# Patient Record
Sex: Female | Born: 1935 | Race: White | Hispanic: No | Smoking: Never smoker
Health system: Southern US, Community
[De-identification: ages and names within clinical notes are randomized; demographics above are authoritative.]

## PROBLEM LIST (undated history)

## (undated) DIAGNOSIS — I6529 Occlusion and stenosis of unspecified carotid artery: Secondary | ICD-10-CM

## (undated) DIAGNOSIS — I639 Cerebral infarction, unspecified: Secondary | ICD-10-CM

## (undated) DIAGNOSIS — I10 Essential (primary) hypertension: Secondary | ICD-10-CM

## (undated) DIAGNOSIS — I2510 Atherosclerotic heart disease of native coronary artery without angina pectoris: Secondary | ICD-10-CM

## (undated) HISTORY — PX: APPENDECTOMY: SHX54

## (undated) HISTORY — PX: CORONARY ARTERY BYPASS GRAFT: SHX141

## (undated) HISTORY — PX: THYROID SURGERY: SHX805

## (undated) HISTORY — PX: RETINAL DETACHMENT SURGERY: SHX105

---

## 2006-05-11 ENCOUNTER — Ambulatory Visit: Payer: Self-pay | Admitting: Internal Medicine

## 2006-07-12 ENCOUNTER — Encounter: Payer: Self-pay | Admitting: Internal Medicine

## 2006-07-15 ENCOUNTER — Encounter: Payer: Self-pay | Admitting: Internal Medicine

## 2006-08-15 ENCOUNTER — Encounter: Payer: Self-pay | Admitting: Internal Medicine

## 2006-09-15 ENCOUNTER — Encounter: Payer: Self-pay | Admitting: Internal Medicine

## 2006-10-03 ENCOUNTER — Other Ambulatory Visit: Payer: Self-pay

## 2006-10-03 ENCOUNTER — Emergency Department: Payer: Self-pay | Admitting: Emergency Medicine

## 2006-10-04 ENCOUNTER — Ambulatory Visit: Payer: Self-pay | Admitting: Ophthalmology

## 2006-10-20 ENCOUNTER — Ambulatory Visit: Payer: Self-pay | Admitting: Surgery

## 2006-11-20 ENCOUNTER — Ambulatory Visit: Payer: Self-pay | Admitting: Surgery

## 2006-11-27 ENCOUNTER — Inpatient Hospital Stay: Payer: Self-pay | Admitting: Surgery

## 2007-01-01 ENCOUNTER — Ambulatory Visit: Payer: Self-pay | Admitting: Surgery

## 2010-03-12 ENCOUNTER — Emergency Department: Payer: Self-pay | Admitting: Emergency Medicine

## 2010-03-18 ENCOUNTER — Ambulatory Visit: Payer: Self-pay | Admitting: Surgery

## 2010-03-19 ENCOUNTER — Ambulatory Visit: Payer: Self-pay | Admitting: Surgery

## 2010-03-31 ENCOUNTER — Ambulatory Visit: Payer: Self-pay | Admitting: Surgery

## 2010-04-13 ENCOUNTER — Ambulatory Visit: Payer: Self-pay | Admitting: Surgery

## 2010-09-21 ENCOUNTER — Ambulatory Visit: Payer: Self-pay | Admitting: Ophthalmology

## 2010-09-29 ENCOUNTER — Ambulatory Visit: Payer: Self-pay | Admitting: Ophthalmology

## 2011-01-20 IMAGING — US ABDOMEN ULTRASOUND
1 series · 17 of 25 positions shown · non-contrast
Comparison: none

REASON FOR EXAM: post operative bd pain nausea
COMMENTS:

[Series 1: abdomen ultrasound · 17 of 43 slices shown]
[im 1/43]
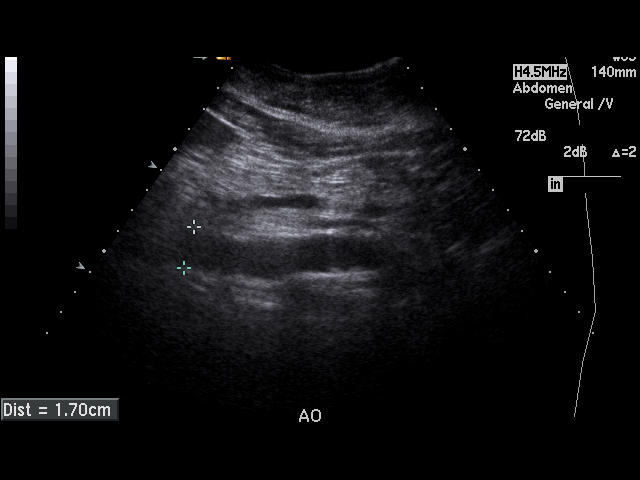
[im 4/43]
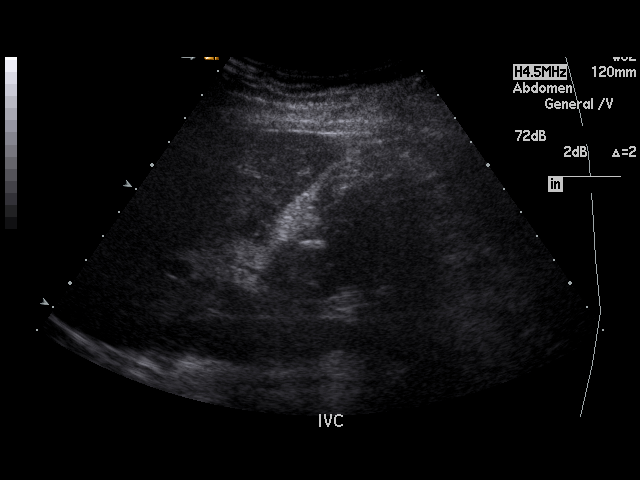
[im 6/43]
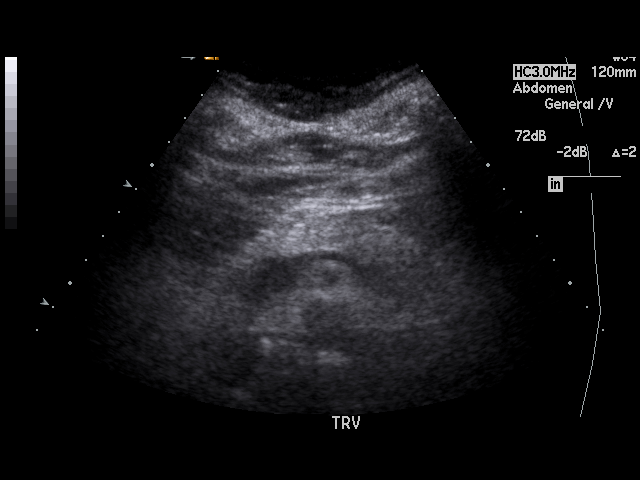
[im 9/43]
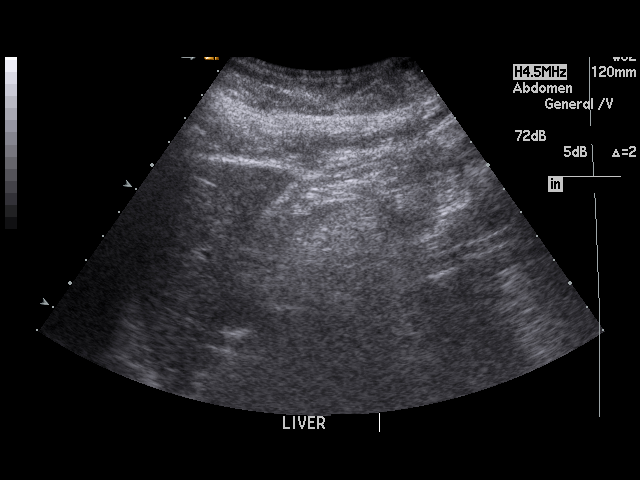
[im 11/43]
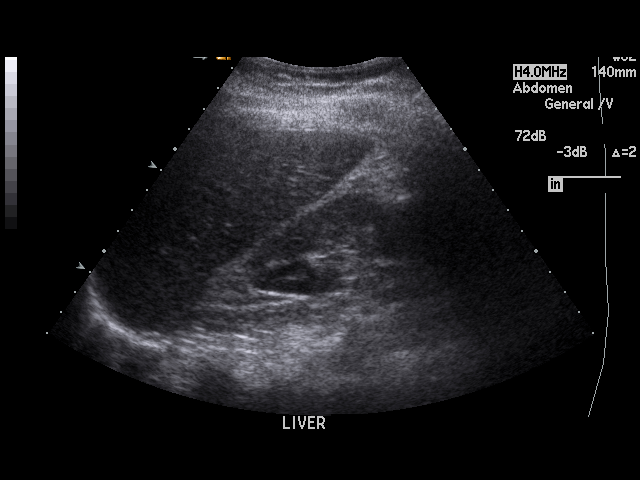
[im 15/43]
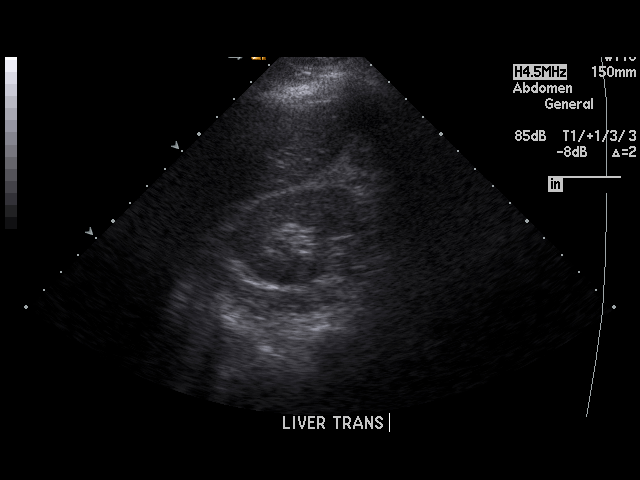
[im 16/43]
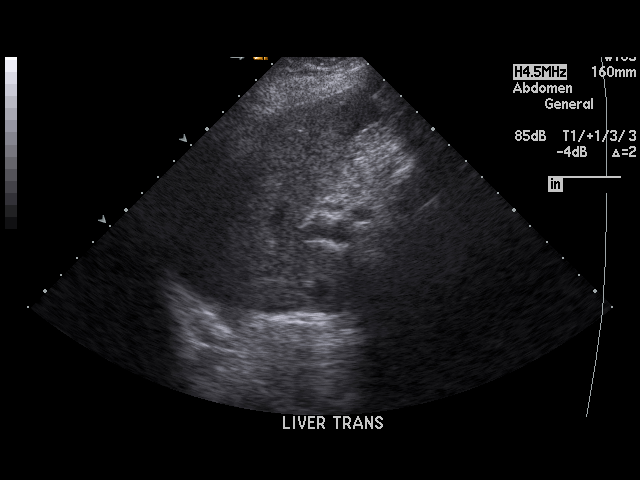
[im 20/43]
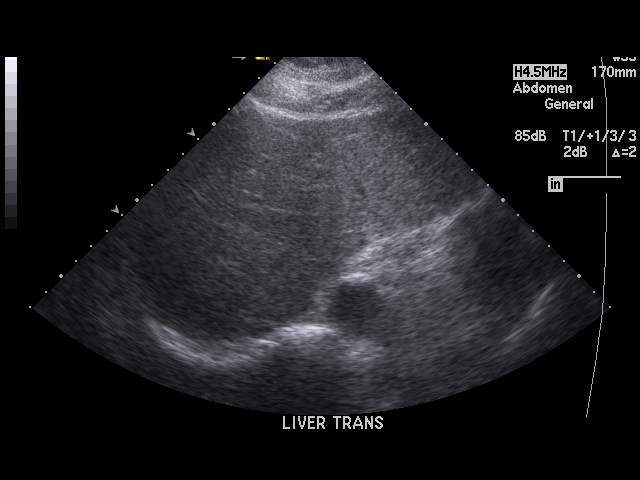
[im 22/43]
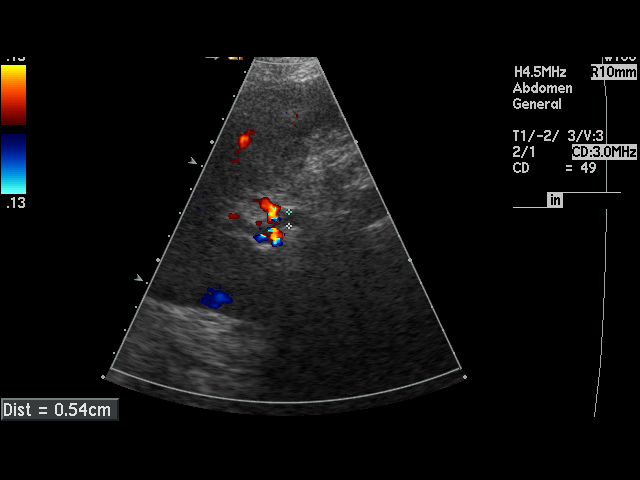
[im 23/43]
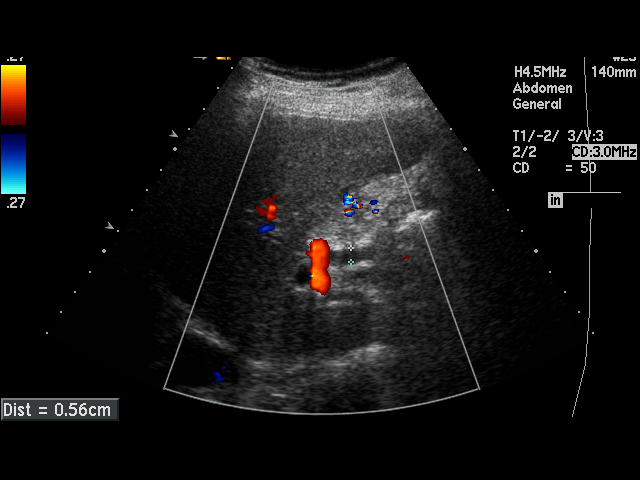
[im 27/43]
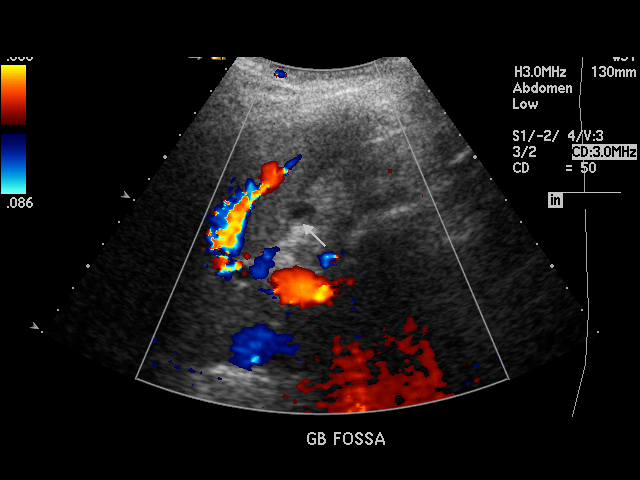
[im 29/43]
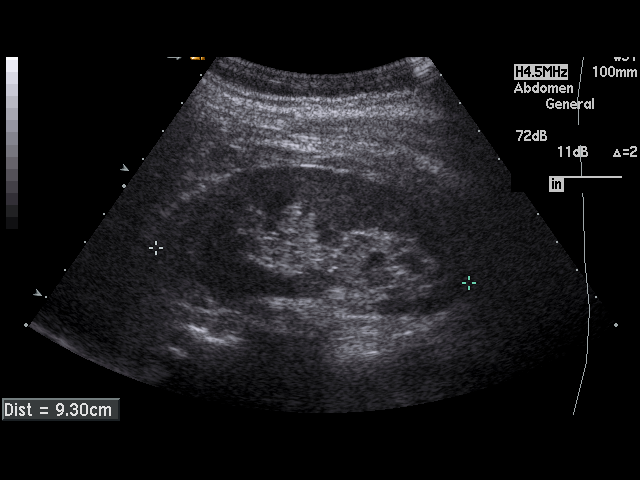
[im 32/43]
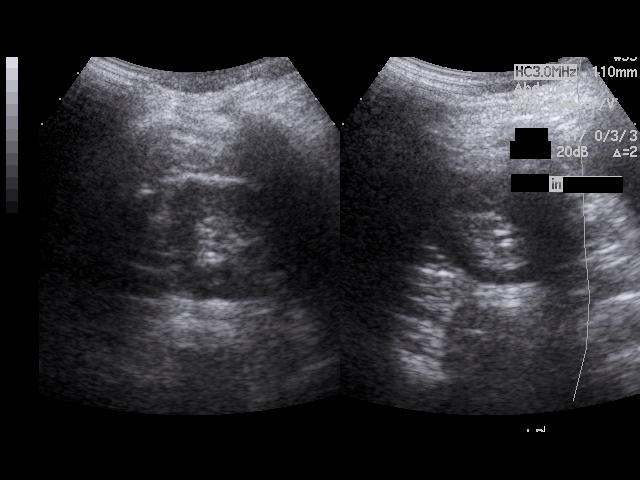
[im 34/43]
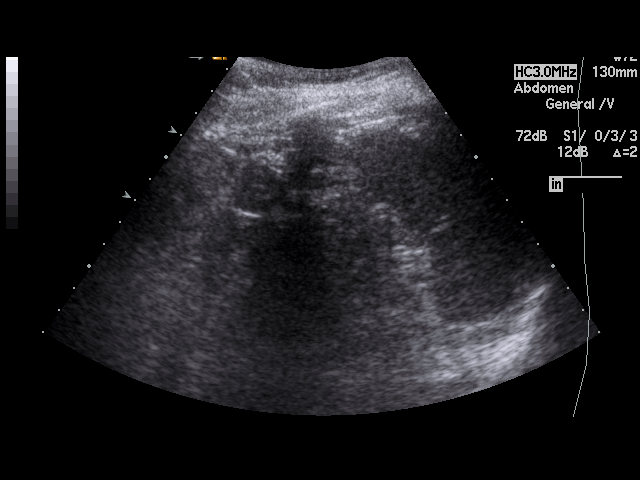
[im 37/43]
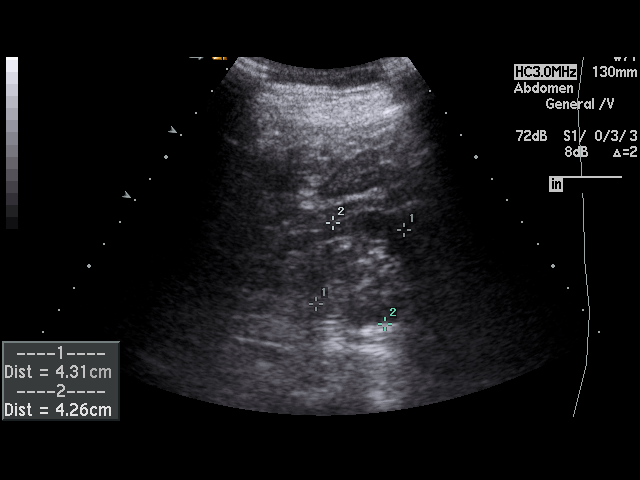
[im 39/43]
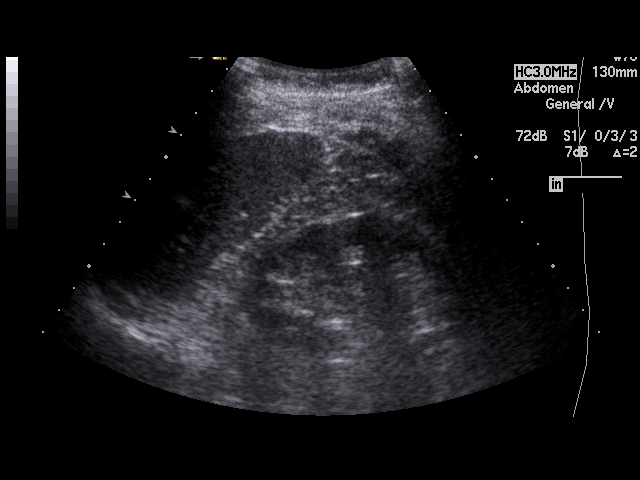
[im 43/43]
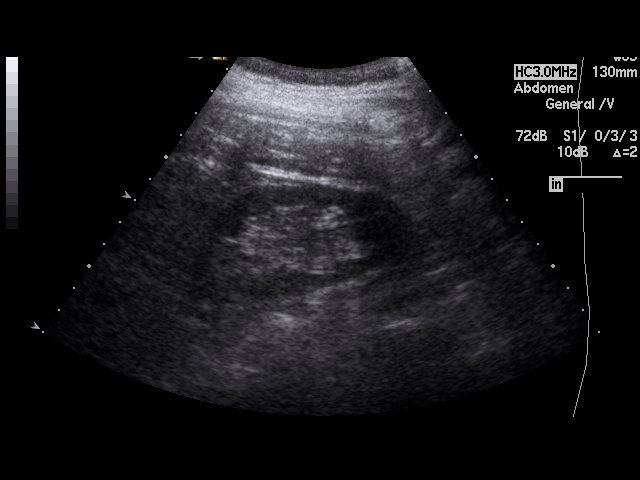

[17 of 25 positions shown; findings below may reference images not displayed]

PROCEDURE:     HOUSSEM EDDIN - HOUSSEM EDDIN ABDOMEN GENERAL SURVEY  - April 13, 2010  [DATE]

RESULT:     The hepatic echo pattern shows no significant abnormalities. The
visualized portion of the pancreas is normal in appearance. The abdominal
aorta and inferior vena cava show no significant abnormalities. Spleen size
is normal. The gallbladder is not seen compatible with prior
cholecystectomy. There is a trace of fluid in the region of the gallbladder
bed, less than 1 cm in diameter and which likely represents postoperative
change. The common bile duct measures 5.6 mm in diameter which is within
normal limits. The kidneys show no hydronephrosis.
IMPRESSION: There is a trace of fluid in the region of the gallbladder bed, otherwise
normal study.

## 2011-08-16 ENCOUNTER — Ambulatory Visit: Payer: Self-pay | Admitting: Internal Medicine

## 2011-08-31 ENCOUNTER — Ambulatory Visit: Payer: Self-pay | Admitting: Internal Medicine

## 2011-09-06 LAB — CBC CANCER CENTER
Eosinophil #: 0.1 x10 3/mm (ref 0.0–0.7)
HCT: 35.6 % (ref 35.0–47.0)
Lymphocyte #: 1.3 x10 3/mm (ref 1.0–3.6)
Lymphocyte %: 33.9 %
MCH: 30.7 pg (ref 26.0–34.0)
MCHC: 33.5 g/dL (ref 32.0–36.0)
Monocyte #: 0.3 x10 3/mm (ref 0.0–0.7)
Monocyte %: 8.8 %
Neutrophil #: 2.2 x10 3/mm (ref 1.4–6.5)
Neutrophil %: 54.4 %
RDW: 12.6 % (ref 11.5–14.5)
WBC: 3.9 x10 3/mm (ref 3.6–11.0)

## 2011-09-06 LAB — RETICULOCYTES: Reticulocyte: 2.7 % — ABNORMAL HIGH (ref 0.5–1.5)

## 2011-09-06 LAB — FERRITIN: Ferritin (ARMC): 43 ng/mL (ref 8–388)

## 2011-09-06 LAB — IRON AND TIBC
Iron Saturation: 32 %
Unbound Iron-Bind.Cap.: 218 ug/dL

## 2011-09-07 LAB — KAPPA/LAMBDA FREE LIGHT CHAINS (ARMC)

## 2011-09-13 LAB — CBC CANCER CENTER
Basophil #: 0 x10 3/mm (ref 0.0–0.1)
Basophil %: 0.6 %
Eosinophil #: 0.1 x10 3/mm (ref 0.0–0.7)
Eosinophil %: 2.8 %
HCT: 36 % (ref 35.0–47.0)
HGB: 11.7 g/dL — ABNORMAL LOW (ref 12.0–16.0)
Lymphocyte #: 1.3 x10 3/mm (ref 1.0–3.6)
Lymphocyte %: 36.1 %
MCHC: 32.5 g/dL (ref 32.0–36.0)
Monocyte %: 10.2 %
Neutrophil #: 1.7 x10 3/mm (ref 1.4–6.5)
Neutrophil %: 50.3 %
RDW: 12.6 % (ref 11.5–14.5)
WBC: 3.5 x10 3/mm — ABNORMAL LOW (ref 3.6–11.0)

## 2011-09-14 LAB — URINE IEP, RANDOM

## 2011-09-16 ENCOUNTER — Ambulatory Visit: Payer: Self-pay | Admitting: Internal Medicine

## 2012-03-01 ENCOUNTER — Ambulatory Visit: Payer: Self-pay | Admitting: Otolaryngology

## 2012-03-01 LAB — T4, FREE: Free Thyroxine: 1.32 ng/dL (ref 0.76–1.46)

## 2012-03-06 ENCOUNTER — Ambulatory Visit: Payer: Self-pay | Admitting: Otolaryngology

## 2012-05-01 ENCOUNTER — Ambulatory Visit: Payer: Self-pay | Admitting: Otolaryngology

## 2012-05-01 LAB — BASIC METABOLIC PANEL
Anion Gap: 6 — ABNORMAL LOW (ref 7–16)
Calcium, Total: 8.9 mg/dL (ref 8.5–10.1)
Co2: 26 mmol/L (ref 21–32)
Glucose: 124 mg/dL — ABNORMAL HIGH (ref 65–99)
Osmolality: 269 (ref 275–301)

## 2012-05-01 LAB — CBC WITH DIFFERENTIAL/PLATELET
Basophil %: 0.6 %
Eosinophil #: 0.1 10*3/uL (ref 0.0–0.7)
Eosinophil %: 1.5 %
HCT: 33.6 % — ABNORMAL LOW (ref 35.0–47.0)
HGB: 11.3 g/dL — ABNORMAL LOW (ref 12.0–16.0)
Lymphocyte %: 31.1 %
MCH: 28.8 pg (ref 26.0–34.0)
MCHC: 33.6 g/dL (ref 32.0–36.0)
Neutrophil #: 2.2 10*3/uL (ref 1.4–6.5)
Neutrophil %: 57.8 %
RBC: 3.92 10*6/uL (ref 3.80–5.20)

## 2012-05-14 ENCOUNTER — Ambulatory Visit: Payer: Self-pay | Admitting: Otolaryngology

## 2012-05-16 LAB — PATHOLOGY REPORT

## 2012-09-20 ENCOUNTER — Ambulatory Visit: Payer: Self-pay | Admitting: Family Medicine

## 2012-10-03 ENCOUNTER — Ambulatory Visit: Payer: Self-pay | Admitting: Otolaryngology

## 2012-10-03 LAB — T4, FREE: Free Thyroxine: 1.17 ng/dL (ref 0.76–1.46)

## 2013-01-01 ENCOUNTER — Ambulatory Visit: Payer: Self-pay | Admitting: Otolaryngology

## 2013-01-01 LAB — TSH: Thyroid Stimulating Horm: 1.89 u[IU]/mL

## 2013-04-01 ENCOUNTER — Ambulatory Visit: Payer: Self-pay | Admitting: Physical Medicine and Rehabilitation

## 2013-07-08 ENCOUNTER — Ambulatory Visit: Payer: Self-pay | Admitting: Otolaryngology

## 2013-11-19 DIAGNOSIS — I447 Left bundle-branch block, unspecified: Secondary | ICD-10-CM | POA: Insufficient documentation

## 2013-11-19 DIAGNOSIS — I25810 Atherosclerosis of coronary artery bypass graft(s) without angina pectoris: Secondary | ICD-10-CM | POA: Insufficient documentation

## 2014-04-22 ENCOUNTER — Emergency Department: Payer: Self-pay | Admitting: Emergency Medicine

## 2014-04-22 LAB — BASIC METABOLIC PANEL
Anion Gap: 7 (ref 7–16)
BUN: 13 mg/dL (ref 7–18)
CHLORIDE: 100 mmol/L (ref 98–107)
Calcium, Total: 9 mg/dL (ref 8.5–10.1)
Co2: 26 mmol/L (ref 21–32)
Creatinine: 1.4 mg/dL — ABNORMAL HIGH (ref 0.60–1.30)
EGFR (African American): 38 — ABNORMAL LOW
GFR CALC NON AF AMER: 33 — AB
Glucose: 111 mg/dL — ABNORMAL HIGH (ref 65–99)
OSMOLALITY: 267 (ref 275–301)
Potassium: 4.3 mmol/L (ref 3.5–5.1)
Sodium: 133 mmol/L — ABNORMAL LOW (ref 136–145)

## 2014-04-22 LAB — CBC WITH DIFFERENTIAL/PLATELET
BASOS PCT: 0.7 %
Basophil #: 0 10*3/uL (ref 0.0–0.1)
EOS ABS: 0.1 10*3/uL (ref 0.0–0.7)
Eosinophil %: 1.6 %
HCT: 42.1 % (ref 35.0–47.0)
HGB: 13.4 g/dL (ref 12.0–16.0)
LYMPHS ABS: 1.3 10*3/uL (ref 1.0–3.6)
Lymphocyte %: 26.6 %
MCH: 28 pg (ref 26.0–34.0)
MCHC: 31.9 g/dL — ABNORMAL LOW (ref 32.0–36.0)
MCV: 88 fL (ref 80–100)
MONOS PCT: 8.8 %
Monocyte #: 0.4 x10 3/mm (ref 0.2–0.9)
NEUTROS PCT: 62.3 %
Neutrophil #: 2.9 10*3/uL (ref 1.4–6.5)
Platelet: 182 10*3/uL (ref 150–440)
RBC: 4.8 10*6/uL (ref 3.80–5.20)
RDW: 14.6 % — ABNORMAL HIGH (ref 11.5–14.5)
WBC: 4.7 10*3/uL (ref 3.6–11.0)

## 2014-04-22 LAB — URINALYSIS, COMPLETE
Bilirubin,UR: NEGATIVE
GLUCOSE, UR: NEGATIVE mg/dL (ref 0–75)
Ketone: NEGATIVE
NITRITE: NEGATIVE
Ph: 6 (ref 4.5–8.0)
Protein: 30
RBC,UR: 2 /HPF (ref 0–5)
Specific Gravity: 1.008 (ref 1.003–1.030)
WBC UR: 6 /HPF (ref 0–5)

## 2014-04-22 LAB — TROPONIN I

## 2014-04-30 DIAGNOSIS — G2581 Restless legs syndrome: Secondary | ICD-10-CM | POA: Insufficient documentation

## 2014-07-16 ENCOUNTER — Ambulatory Visit: Payer: Self-pay | Admitting: Otolaryngology

## 2014-07-16 LAB — T4, FREE: Free Thyroxine: 1.34 ng/dL (ref 0.76–1.46)

## 2014-07-16 LAB — TSH: Thyroid Stimulating Horm: 1.61 u[IU]/mL

## 2014-07-30 DIAGNOSIS — I340 Nonrheumatic mitral (valve) insufficiency: Secondary | ICD-10-CM | POA: Insufficient documentation

## 2014-07-30 DIAGNOSIS — I272 Pulmonary hypertension, unspecified: Secondary | ICD-10-CM | POA: Insufficient documentation

## 2014-10-29 DIAGNOSIS — E039 Hypothyroidism, unspecified: Secondary | ICD-10-CM | POA: Insufficient documentation

## 2014-11-14 ENCOUNTER — Ambulatory Visit
Admit: 2014-11-14 | Disposition: A | Discharge status: Home / Self Care | Payer: Self-pay | Attending: Ophthalmology | Admitting: Ophthalmology

## 2014-11-19 ENCOUNTER — Ambulatory Visit
Admit: 2014-11-19 | Disposition: A | Discharge status: Home / Self Care | Payer: Self-pay | Attending: Ophthalmology | Admitting: Ophthalmology

## 2014-12-02 NOTE — Op Note (Signed)
PATIENT NAME:  Gabriella Hawkins, Gabriella Hawkins MR#:  [id number] DATE OF BIRTH:  10-20-1919  DATE OF PROCEDURE:  05/14/2012  PREOPERATIVE DIAGNOSIS: Large left thyroid nodule.   POSTOPERATIVE DIAGNOSIS: Large left thyroid nodule.  OPERATIVE PROCEDURE: Excision of left thyroid gland and isthmus.  SURGEON: Cammy CopaPaul H. Zayquan Bogard, MD   ASSISTANT: Karlyne GreenspanWilliam Vaught, MD   ANESTHESIA: General.   COMPLICATIONS: None.   TOTAL ESTIMATED BLOOD LOSS: 25 mL.   DESCRIPTION OF PROCEDURE: The patient was given general anesthesia by oral endotracheal intubation. A special endotracheal tube was used for monitoring of the laryngeal muscles and the recurrent laryngeal nerve. This was visualized to make sure it was sitting in the right position in the larynx. The patient was placed in the spine position. A shoulder roll was placed and the neck extended slightly. The neck was marked about three fingerbreadths above the sternal notch in a curvilinear horizontal fashion. The skin was infiltrated with 7 mL of 1% Xylocaine with epinephrine 1:100,000. She was prepped and draped in the sterile fashion.   An incision was created through the skin and subcutaneous tissue. The platysma muscle was divided and superiorly and inferiorly based flaps were elevated slightly. The strap muscles were then divided in the midline and were elevated over the left thyroid gland. This was a very large thyroid gland and it was across the midline over the thyroid as well. Dissection was carried around the gland underneath the strap muscles superiorly and inferiorly. The inferior attachments were freed up first. The inferior thyroid vessels were cut across with the Ultrus thermal cutting system. This coagulated and cut the vessels and freed up the inferior pole. The more lateral veins were cut across as well. The more superior pole was then freed up and again the superior vessels were cut across with the Ultrus coagulating and cutting system. Once the superior and  inferior poles were freed up, then the entire gland was rotated and it could be freed up and delivered from the wound. It was sitting out and the entire mass was probably close to 8 cm in size from superior to inferior and at least 6 cm in width. It crossed over the midline of the thyroid. It was soft and quite vascular. The bed underneath it laterally now was freed up some and the recurrent laryngeal nerve was found. It was stimulated and working well. The blood vessels overlying this were freed up and then Berry's ligament was freed up as well. Once this was freed, the gland was then only attached by the isthmus across to the right gland. The right gland was not enlarged. They were swollen. There were no nodules palpable. The isthmus was cut off as it attached to the right gland. This removed the entire gland and isthmus on the left side.   The bed was irrigated. They were a couple of small bleeding areas that were coagulated. It appeared as though a left superior parathyroid gland was found but not an inferior parathyroid gland. The recurrent laryngeal nerve was stimulated again and this moved the muscles well.   The bed was then covered with a half a piece of Surgicel and then a 10 [place] TLS drain was placed through a separate stab incision and laid down into the wound.   Wound was closed using 4-0 Vicryls for closing the strap muscles and then 4-0 Vicryls for the platysma, 4-0 Vicryls for the dermis, and then staples for the skin. The drain was placed to low continuous Vacutainer suction. The  wound was dressed with bacitracin, Telfa, and Tegaderm.  The patient tolerated the procedure well. She was awakened and taken to the recovery room in satisfactory condition. There were no operative complications.   ____________________________ Cammy Copa, MD phj:drc D: 05/14/2012 10:05:47 ET T: 05/14/2012 10:24:59 ET JOB#: [id number]  cc: Cammy Copa, MD, <Dictator> Cammy Copa MD ELECTRONICALLY  SIGNED 05/14/2012 19:02

## 2014-12-14 NOTE — Op Note (Signed)
PATIENT NAME:  Gabriella Hawkins, Gabriella Hawkins MR#:  [id number] DATE OF BIRTH:  1920/03/11  DATE OF PROCEDURE:  11/19/2014  PREPROCEDURE DIAGNOSIS: Epiretinal membrane with cystoid macular edema, left eye.   POSTPROCEDURE DIAGNOSES:  Epiretinal membrane with cystoid macular edema, left eye.  PROCEDURE: A 25-gauge pars plana vitrectomy with ICG, ERM, and ILM peeling, a partial air-fluid exchange.   ANESTHESIA: Monitored anesthesia care with retrobulbar block.   COMPLICATIONS: None.   ESTIMATED BLOOD LOSS: Minimal.  SPECIMENS:  None.  DESCRIPTION OF PROCEDURE: The patient was evaluated in the clinic for a visually significant epiretinal membrane in the left eye. Risks, benefits, alternatives, and the complications were discussed with patient. The patient elected to proceed with pars plana vitrectomy surgery with membrane peeling. Of note, the patient did have a previous vitrectomy in the past.  On the day of surgery, the patient was greeted in the preoperative holding area. The consents were reviewed. The left eye was marked. The patient was taken to the operating room in a supine position.  Monitored anesthesia care was administered and 5 mL of a peribulbar block consisting of a lidocaine and Marcaine plain with Wydase was injected. The left eye was then prepped and draped in the usual sterile fashion.  Twenty-five-gauge trocars were placed in the usual position. The infusion cannula was checked to make sure it was in in the vitreous cavity before starting the infusion. The patient did have a prior vitrectomy, so there was not much vitreous remaining in the vitreous cavity, but it was shortened for 360 degrees around the periphery. Attention was then directed to the macula. ICG was used to stain the retinal surface. The patient indeed had an epiretinal membrane causing significant distortion of the macula. The ILM forceps were used to then peel the ILM and epiretinal membrane around the fovea.  The ILM had been  partially peeled in a previous surgery and so that edge was taken out further for 360 degrees around the fovea and the epiretinal membrane on the fovea was removed. The attention was then drawn to the periphery and 360 degrees scleral depression was performed with no new retinal tears or detachments noted. The infusion was then turned to air and the patient was given a partial air bubble.  The trocars were then removed and they were felt to be watertight. Palpation was soft.  The subconjunctival dexamethasone was injected as the patient has a penicillin allergy and the patient was then patched and shielded and taken to the recovery area in stable condition.   ____________________________ Princella PellegriniJessica D. Duke Salviaandolph, MD jdr:sp D: 11/19/2014 10:00:31 ET T: 11/19/2014 10:57:59 ET JOB#: [id number]  cc: Shanda BumpsJessica D. Duke Salviaandolph, MD, <Dictator> Dartha LodgeJESSICA D Winfall MD ELECTRONICALLY SIGNED 11/26/2014 10:31

## 2015-01-23 DIAGNOSIS — I10 Essential (primary) hypertension: Secondary | ICD-10-CM | POA: Insufficient documentation

## 2015-02-23 DIAGNOSIS — I071 Rheumatic tricuspid insufficiency: Secondary | ICD-10-CM | POA: Insufficient documentation

## 2015-07-22 ENCOUNTER — Other Ambulatory Visit
Admission: RE | Admit: 2015-07-22 | Discharge: 2015-07-22 | Disposition: A | Discharge status: Home / Self Care | Payer: Medicare Other | Source: Ambulatory Visit | Attending: Otolaryngology | Admitting: Otolaryngology

## 2015-07-22 DIAGNOSIS — E039 Hypothyroidism, unspecified: Secondary | ICD-10-CM | POA: Diagnosis present

## 2015-07-22 LAB — TSH: TSH: 0.652 u[IU]/mL (ref 0.350–4.500)

## 2015-07-22 LAB — T4, FREE: FREE T4: 1.07 ng/dL (ref 0.61–1.12)

## 2015-07-23 LAB — T3, FREE: T3, Free: 2.3 pg/mL (ref 2.0–4.4)

## 2015-11-03 DIAGNOSIS — H3411 Central retinal artery occlusion, right eye: Secondary | ICD-10-CM | POA: Insufficient documentation

## 2015-11-03 DIAGNOSIS — H35372 Puckering of macula, left eye: Secondary | ICD-10-CM | POA: Insufficient documentation

## 2016-01-26 DIAGNOSIS — E538 Deficiency of other specified B group vitamins: Secondary | ICD-10-CM | POA: Insufficient documentation

## 2016-08-16 ENCOUNTER — Other Ambulatory Visit
Admission: RE | Admit: 2016-08-16 | Discharge: 2016-08-16 | Disposition: A | Discharge status: Home / Self Care | Payer: Medicare Other | Source: Ambulatory Visit | Attending: Otolaryngology | Admitting: Otolaryngology

## 2016-08-16 DIAGNOSIS — E890 Postprocedural hypothyroidism: Secondary | ICD-10-CM | POA: Diagnosis present

## 2016-08-16 LAB — TSH: TSH: 0.661 u[IU]/mL (ref 0.350–4.500)

## 2016-08-16 LAB — T4, FREE: FREE T4: 1.3 ng/dL — AB (ref 0.61–1.12)

## 2016-08-17 LAB — T3, FREE: T3, Free: 2.1 pg/mL (ref 2.0–4.4)

## 2017-05-17 ENCOUNTER — Encounter: Payer: Self-pay | Admitting: Internal Medicine

## 2017-05-17 ENCOUNTER — Observation Stay
Admission: EM | Admit: 2017-05-17 | Discharge: 2017-05-18 | Disposition: A | Discharge status: Home / Self Care | Payer: Medicare Other | Attending: Internal Medicine | Admitting: Internal Medicine

## 2017-05-17 ENCOUNTER — Observation Stay
Admit: 2017-05-17 | Discharge: 2017-05-17 | Disposition: A | Discharge status: Home / Self Care | Payer: Medicare Other | Attending: Internal Medicine | Admitting: Internal Medicine

## 2017-05-17 ENCOUNTER — Emergency Department: Payer: Medicare Other

## 2017-05-17 DIAGNOSIS — Z79899 Other long term (current) drug therapy: Secondary | ICD-10-CM | POA: Insufficient documentation

## 2017-05-17 DIAGNOSIS — I25110 Atherosclerotic heart disease of native coronary artery with unstable angina pectoris: Secondary | ICD-10-CM | POA: Diagnosis not present

## 2017-05-17 DIAGNOSIS — I214 Non-ST elevation (NSTEMI) myocardial infarction: Principal | ICD-10-CM | POA: Insufficient documentation

## 2017-05-17 DIAGNOSIS — Z888 Allergy status to other drugs, medicaments and biological substances status: Secondary | ICD-10-CM | POA: Diagnosis not present

## 2017-05-17 DIAGNOSIS — Z8673 Personal history of transient ischemic attack (TIA), and cerebral infarction without residual deficits: Secondary | ICD-10-CM | POA: Diagnosis not present

## 2017-05-17 DIAGNOSIS — Z7982 Long term (current) use of aspirin: Secondary | ICD-10-CM | POA: Diagnosis not present

## 2017-05-17 DIAGNOSIS — Z951 Presence of aortocoronary bypass graft: Secondary | ICD-10-CM | POA: Diagnosis not present

## 2017-05-17 DIAGNOSIS — G2581 Restless legs syndrome: Secondary | ICD-10-CM | POA: Diagnosis not present

## 2017-05-17 DIAGNOSIS — I119 Hypertensive heart disease without heart failure: Secondary | ICD-10-CM | POA: Insufficient documentation

## 2017-05-17 DIAGNOSIS — R079 Chest pain, unspecified: Secondary | ICD-10-CM | POA: Diagnosis present

## 2017-05-17 DIAGNOSIS — I6529 Occlusion and stenosis of unspecified carotid artery: Secondary | ICD-10-CM | POA: Diagnosis not present

## 2017-05-17 DIAGNOSIS — E785 Hyperlipidemia, unspecified: Secondary | ICD-10-CM | POA: Diagnosis not present

## 2017-05-17 DIAGNOSIS — Z7902 Long term (current) use of antithrombotics/antiplatelets: Secondary | ICD-10-CM | POA: Insufficient documentation

## 2017-05-17 DIAGNOSIS — I200 Unstable angina: Secondary | ICD-10-CM

## 2017-05-17 DIAGNOSIS — I10 Essential (primary) hypertension: Secondary | ICD-10-CM

## 2017-05-17 HISTORY — DX: Atherosclerotic heart disease of native coronary artery without angina pectoris: I25.10

## 2017-05-17 HISTORY — DX: Occlusion and stenosis of unspecified carotid artery: I65.29

## 2017-05-17 HISTORY — DX: Essential (primary) hypertension: I10

## 2017-05-17 HISTORY — DX: Cerebral infarction, unspecified: I63.9

## 2017-05-17 LAB — TROPONIN I
TROPONIN I: 0.32 ng/mL — AB (ref <0.03)
Troponin I: 0.94 ng/mL (ref <0.03)
Troponin I: 0.98 ng/mL (ref <0.03)

## 2017-05-17 LAB — CBC WITH DIFFERENTIAL/PLATELET
BASOS PCT: 1 %
Basophils Absolute: 0.1 10*3/uL (ref 0–0.1)
EOS ABS: 0.1 10*3/uL (ref 0–0.7)
Eosinophils Relative: 3 %
HEMATOCRIT: 37.6 % (ref 35.0–47.0)
HEMOGLOBIN: 12.8 g/dL (ref 12.0–16.0)
Lymphocytes Relative: 35 %
Lymphs Abs: 1.8 10*3/uL (ref 1.0–3.6)
MCH: 29.8 pg (ref 26.0–34.0)
MCHC: 33.9 g/dL (ref 32.0–36.0)
MCV: 87.9 fL (ref 80.0–100.0)
MONO ABS: 0.6 10*3/uL (ref 0.2–0.9)
MONOS PCT: 12 %
NEUTROS ABS: 2.6 10*3/uL (ref 1.4–6.5)
Neutrophils Relative %: 49 %
Platelets: 164 10*3/uL (ref 150–440)
RBC: 4.28 MIL/uL (ref 3.80–5.20)
RDW: 14.6 % — ABNORMAL HIGH (ref 11.5–14.5)
WBC: 5.2 10*3/uL (ref 3.6–11.0)

## 2017-05-17 LAB — COMPREHENSIVE METABOLIC PANEL
ALBUMIN: 4 g/dL (ref 3.5–5.0)
ALK PHOS: 46 U/L (ref 38–126)
ALT: 24 U/L (ref 14–54)
ANION GAP: 9 (ref 5–15)
AST: 40 U/L (ref 15–41)
BILIRUBIN TOTAL: 0.7 mg/dL (ref 0.3–1.2)
BUN: 27 mg/dL — ABNORMAL HIGH (ref 6–20)
CALCIUM: 9.4 mg/dL (ref 8.9–10.3)
CO2: 25 mmol/L (ref 22–32)
Chloride: 95 mmol/L — ABNORMAL LOW (ref 101–111)
Creatinine, Ser: 1.19 mg/dL — ABNORMAL HIGH (ref 0.44–1.00)
GFR calc non Af Amer: 38 mL/min — ABNORMAL LOW (ref >60)
GFR, EST AFRICAN AMERICAN: 44 mL/min — AB (ref >60)
GLUCOSE: 140 mg/dL — AB (ref 65–99)
POTASSIUM: 3.5 mmol/L (ref 3.5–5.1)
Sodium: 129 mmol/L — ABNORMAL LOW (ref 135–145)
TOTAL PROTEIN: 7.2 g/dL (ref 6.5–8.1)

## 2017-05-17 LAB — ECHOCARDIOGRAM COMPLETE
Height: 65.5 in
Weight: 2352 oz

## 2017-05-17 LAB — BRAIN NATRIURETIC PEPTIDE: B NATRIURETIC PEPTIDE 5: 184 pg/mL — AB (ref 0.0–100.0)

## 2017-05-17 MED ORDER — ATORVASTATIN CALCIUM 20 MG PO TABS
20.0000 mg | ORAL_TABLET | Freq: Every day | ORAL | Status: DC
  Administered 2017-05-17: 20 mg via ORAL
  Filled 2017-05-17: qty 1

## 2017-05-17 MED ORDER — SODIUM CHLORIDE 0.9% FLUSH
3.0000 mL | Freq: Two times a day (BID) | INTRAVENOUS | Status: DC
  Administered 2017-05-17 – 2017-05-18 (×3): 3 mL via INTRAVENOUS

## 2017-05-17 MED ORDER — ONDANSETRON HCL 4 MG/2ML IJ SOLN: 4.0000 mg | Freq: Four times a day (QID) | INTRAMUSCULAR | Status: DC | PRN

## 2017-05-17 MED ORDER — ASPIRIN EC 81 MG PO TBEC
81.0000 mg | DELAYED_RELEASE_TABLET | Freq: Every day | ORAL | Status: DC
  Administered 2017-05-18: 81 mg via ORAL
  Filled 2017-05-17: qty 1

## 2017-05-17 MED ORDER — ASPIRIN 81 MG PO CHEW: 324.0000 mg | CHEWABLE_TABLET | ORAL | Status: AC

## 2017-05-17 MED ORDER — CLOPIDOGREL BISULFATE 75 MG PO TABS
75.0000 mg | ORAL_TABLET | Freq: Every day | ORAL | Status: DC
  Administered 2017-05-17 – 2017-05-18 (×2): 75 mg via ORAL
  Filled 2017-05-17 (×2): qty 1

## 2017-05-17 MED ORDER — SODIUM CHLORIDE 0.9 % IV SOLN: 250.0000 mL | INTRAVENOUS | Status: DC | PRN

## 2017-05-17 MED ORDER — LEVOTHYROXINE SODIUM 50 MCG PO TABS
75.0000 ug | ORAL_TABLET | ORAL | Status: DC
  Administered 2017-05-18: 75 ug via ORAL
  Filled 2017-05-17: qty 1

## 2017-05-17 MED ORDER — NITROGLYCERIN 2 % TD OINT
TOPICAL_OINTMENT | TRANSDERMAL | Status: AC
  Administered 2017-05-17: 1 [in_us] via TOPICAL
  Filled 2017-05-17: qty 1

## 2017-05-17 MED ORDER — SODIUM CHLORIDE 0.9% FLUSH: 3.0000 mL | INTRAVENOUS | Status: DC | PRN

## 2017-05-17 MED ORDER — ASPIRIN 300 MG RE SUPP: 300.0000 mg | RECTAL | Status: AC

## 2017-05-17 MED ORDER — NITROGLYCERIN 0.4 MG SL SUBL: 0.4000 mg | SUBLINGUAL_TABLET | SUBLINGUAL | Status: DC | PRN

## 2017-05-17 MED ORDER — NITROGLYCERIN 2 % TD OINT
1.0000 [in_us] | TOPICAL_OINTMENT | Freq: Once | TRANSDERMAL | Status: AC
  Administered 2017-05-17: 1 [in_us] via TOPICAL

## 2017-05-17 MED ORDER — ENOXAPARIN SODIUM 40 MG/0.4ML ~~LOC~~ SOLN
40.0000 mg | SUBCUTANEOUS | Status: DC
Start: 2017-05-17 — End: 2017-05-18
  Administered 2017-05-17: 40 mg via SUBCUTANEOUS
  Filled 2017-05-17 (×2): qty 0.4

## 2017-05-17 MED ORDER — GABAPENTIN 100 MG PO CAPS
100.0000 mg | ORAL_CAPSULE | Freq: Every day | ORAL | Status: DC
  Administered 2017-05-17: 100 mg via ORAL
  Filled 2017-05-17: qty 1

## 2017-05-17 NOTE — H&P (Signed)
[facility] [place] [facility] [place]   PATIENT NAME: Gabriella Hawkins    MR#:  [id number]  DATE OF BIRTH:  04/07/1920  DATE OF ADMISSION:  05/17/2017  PRIMARY CARE PHYSICIAN: Sula Rumple, MD   REQUESTING/REFERRING PHYSICIAN:   CHIEF COMPLAINT:   Chief Complaint  Patient presents with  . Chest Pain    HISTORY OF PRESENT ILLNESS: Gabriella Hawkins  is a 83 y.o. female with a known history of Carotid artery disease, carotid atherosclerosis, CVA, hypertension, hyperlipidemia, restless leg syndrome, valvular heart disease presented to the emergency room with chest pain. Chest pain started around 3 AM this morning. The pain was intense and sharp in nature 6 out of 10 on a scale of 1-10. No complaints of shortness of breath, orthopnea. Patient was evaluated in the emergency room troponin was negative. EKG showed old left bundle branch block. Hospitalist service was consulted. Patient is DO NOT RESUSCITATE by CODE STATUS.  PAST MEDICAL HISTORY:   Past Medical History:  Diagnosis Date  . CAD (coronary artery disease)   . Carotid atherosclerosis   . CVA (cerebral vascular accident) (HCC)   . Hypertension     PAST SURGICAL HISTORY: Past Surgical History:  Procedure Laterality Date  . APPENDECTOMY    . CORONARY ARTERY BYPASS GRAFT    . RETINAL DETACHMENT SURGERY    . THYROID SURGERY      SOCIAL HISTORY:  Social History  Substance Use Topics  . Smoking status: Never Smoker  . Smokeless tobacco: Never Used  . Alcohol use No    FAMILY HISTORY:  Family History  Problem Relation Age of Onset  . Hypertension Neg Hx   . Diabetes Mellitus II Neg Hx     DRUG ALLERGIES:  Allergies  Allergen Reactions  . Phenobarbital     Doesn't remember  . Tramadol     Other reaction(s): Unknown  . Lisinopril Other (See Comments)    dizzy    REVIEW OF SYSTEMS:   CONSTITUTIONAL: No fever, fatigue or weakness.  EYES: No blurred or double vision.  EARS, NOSE, AND  THROAT: No tinnitus or ear pain.  RESPIRATORY: No cough, shortness of breath, wheezing or hemoptysis.  CARDIOVASCULAR: Has chest pain,  No orthopnea, edema.  GASTROINTESTINAL: No nausea, vomiting, diarrhea or abdominal pain.  GENITOURINARY: No dysuria, hematuria.  ENDOCRINE: No polyuria, nocturia,  HEMATOLOGY: No anemia, easy bruising or bleeding SKIN: No rash or lesion. MUSCULOSKELETAL: No joint pain or arthritis.   NEUROLOGIC: No tingling, numbness, weakness.  PSYCHIATRY: No anxiety or depression.   MEDICATIONS AT HOME:  Prior to Admission medications   Medication Sig Start Date End Date Taking? Authorizing Provider  acetaminophen (TYLENOL) 325 MG tablet Take 650 mg by mouth every 4 (four) hours as needed.   Yes [provider]  aspirin EC 81 MG tablet Take 81 mg by mouth daily.   Yes [provider]  Cholecalciferol (VITAMIN D-1000 MAX ST) 1000 units tablet Take 1,000 Units by mouth daily. 10/29/14  Yes [provider]  gabapentin (NEURONTIN) 100 MG capsule Take 100 mg by mouth at bedtime. 04/18/17  Yes [provider]  hydrochlorothiazide (HYDRODIURIL) 25 MG tablet Take 25 mg by mouth daily. 04/13/17  Yes [provider]  levothyroxine (SYNTHROID, LEVOTHROID) 75 MCG tablet Take 75 mcg by mouth every morning. 07/13/15  Yes [provider]  vitamin B-12 (CYANOCOBALAMIN) 1000 MCG tablet Take 1,000 mcg by mouth daily.   Yes [provider]  carvedilol (COREG) 12.5 MG  tablet Take 2 tablets by mouth 2 (two) times daily. 03/20/17   [provider]      PHYSICAL EXAMINATION:   VITAL SIGNS: Blood pressure (!) 162/76, pulse 73, resp. rate 18, height 5' 5.5" (1.664 m), weight 66.7 kg (147 lb), SpO2 96 %.  GENERAL:  83 y.o.-year-old elderly patient lying in the bed with no acute distress.  EYES: Pupils equal, round, reactive to light and accommodation. No scleral icterus. Extraocular muscles intact.  HEENT: Head atraumatic,  normocephalic. Oropharynx and nasopharynx clear.  NECK:  Supple, no jugular venous distention. No thyroid enlargement, no tenderness.  LUNGS: Normal breath sounds bilaterally, no wheezing, rales,rhonchi or crepitation. No use of accessory muscles of respiration.  CARDIOVASCULAR: S1, S2 normal. No murmurs, rubs, or gallops.  ABDOMEN: Soft, nontender, nondistended. Bowel sounds present. No organomegaly or mass.  EXTREMITIES: No pedal edema, cyanosis, or clubbing.  NEUROLOGIC: Cranial nerves II through XII are intact. Muscle strength 5/5 in all extremities. Sensation intact. Gait not checked.  PSYCHIATRIC: The patient is alert and oriented x 3.  SKIN: No obvious rash, lesion, or ulcer.   LABORATORY PANEL:   CBC  Recent Labs Lab 05/17/17 0404  WBC 5.2  HGB 12.8  HCT 37.6  PLT 164  MCV 87.9  MCH 29.8  MCHC 33.9  RDW 14.6*  LYMPHSABS 1.8  MONOABS 0.6  EOSABS 0.1  BASOSABS 0.1   ------------------------------------------------------------------------------------------------------------------  Chemistries   Recent Labs Lab 05/17/17 0404  NA 129*  K 3.5  CL 95*  CO2 25  GLUCOSE 140*  BUN 27*  CREATININE 1.19*  CALCIUM 9.4  AST 40  ALT 24  ALKPHOS 46  BILITOT 0.7   ------------------------------------------------------------------------------------------------------------------ estimated creatinine clearance is 26 mL/min (A) (by C-G formula based on SCr of 1.19 mg/dL (H)). ------------------------------------------------------------------------------------------------------------------ No results for input(s): TSH, T4TOTAL, T3FREE, THYROIDAB in the last 72 hours.  Invalid input(s): FREET3   Coagulation profile No results for input(s): INR, PROTIME in the last 168 hours. ------------------------------------------------------------------------------------------------------------------- No results for input(s): DDIMER in the last 72  hours. -------------------------------------------------------------------------------------------------------------------  Cardiac Enzymes  Recent Labs Lab 05/17/17 0404  TROPONINI <0.03   ------------------------------------------------------------------------------------------------------------------ Invalid input(s): POCBNP  ---------------------------------------------------------------------------------------------------------------  Urinalysis    Component Value Date/Time   COLORURINE Yellow 04/22/2014 0937   APPEARANCEUR Hazy 04/22/2014 0937   LABSPEC 1.008 04/22/2014 0937   PHURINE 6.0 04/22/2014 0937   GLUCOSEU Negative 04/22/2014 0937   HGBUR 1+ 04/22/2014 0937   BILIRUBINUR Negative 04/22/2014 0937   KETONESUR Negative 04/22/2014 0937   PROTEINUR 30 mg/dL 16/05/9603 5409   NITRITE Negative 04/22/2014 0937   LEUKOCYTESUR Trace 04/22/2014 8119     RADIOLOGY: Dg Chest Port 1 View  Result Date: 05/17/2017 CLINICAL DATA:  Chest pain, nausea. EXAM: PORTABLE CHEST 1 VIEW COMPARISON:  Chest radiograph March 21, 2010 FINDINGS: Stable cardiomegaly. Moderate hiatal hernia with air-fluid level. Calcified aortic knob. Status post median sternotomy for CABG. Mild chronic interstitial changes without pleural effusion or focal consolidation. No pneumothorax. Osteopenia. Soft tissue planes are nonsuspicious. IMPRESSION: Stable cardiomegaly and chronic interstitial changes. Moderate hiatal hernia. Electronically Signed   By: Awilda Metro M.D.   On: 05/17/2017 04:32    EKG: Orders placed or performed during the hospital encounter of 05/17/17  . EKG 12-Lead  . EKG 12-Lead  . ED EKG within 10 minutes  . ED EKG within 10 minutes    IMPRESSION AND PLAN: 83 year old female patient with history of coronary artery disease, carotid atherosclerosis, hypertension, hyperlipidemia presented to the emergency room with  chest pain.  Admitting diagnosis 1. Unstable angina 2. Coronary  artery disease 3. Hyperlipidemia 4. Hypertension Treatment plan Admit patient to telemetry observation bed Aspirin 81 mg daily Cycle troponin and check echocardiogram Cardiology consultation Check lipid panel  All the records are reviewed and case discussed with ED provider. Management plans discussed with the patient, family and they are in agreement.  CODE STATUS:DNR    Code Status Orders        Start     Ordered   05/17/17 0605  Do not attempt resuscitation (DNR)  Continuous    Question Answer Comment  In the event of cardiac or respiratory ARREST Do not call a "code blue"   In the event of cardiac or respiratory ARREST Do not perform Intubation, CPR, defibrillation or ACLS   In the event of cardiac or respiratory ARREST Use medication by any route, position, wound care, and other measures to relive pain and suffering. May use oxygen, suction and manual treatment of airway obstruction as needed for comfort.      05/17/17 0604    Code Status History    Date Active Date Inactive Code Status Order ID Comments User Context   This patient has a current code status but no historical code status.       TOTAL TIME TAKING CARE OF THIS PATIENT: 50 minutes.    Ihor Austin M.D on 05/17/2017 at 6:05 AM  Between 7am to 6pm - Pager - [phone]  After 6pm go to [email or web address] - password [email or web address] [facility]  [facility]  Office  [phone]  CC: Primary care physician; Sula Rumple, MD

## 2017-05-17 NOTE — ED Notes (Signed)
Pt placed on the bedpan and was able to urinate. Pt able to lift her hips. Belongings placed in bag. Pain has improved.

## 2017-05-17 NOTE — ED Notes (Signed)
Patient is resting comfortably. 

## 2017-05-17 NOTE — Consult Note (Signed)
[facility] Cardiology Consultation Note  Patient ID: Gabriella Hawkins, MRN: [id number], DOB/AGE: 02/02/1920 83 y.o. Admit date: 05/17/2017   Date of Consult: 05/17/2017 Primary Physician: Sula Rumple, MD Primary Cardiologist:Tyresa Prindiville  Chief Complaint:  Chief Complaint  Patient presents with  . Chest Pain   Reason for Consult: chest pain with elevated troponin  HPI: 83 y.o. female with known coronary artery disease status post coronary artery bypass graft with left bundle-branch block severe mitral and tricuspid regurgitation with pulmonary hypertension essential hypertension mixed hyperlipidemia and chronic kidney disease stage III who has had new onset of severe substernal chest discomfort radiating into the back lasting for approximately 30-45 minutes and relieved when seen in the emergency room. She did have an EKG unchanged with left bundle-branch block with resolution of the chest pain she has not had any further issues since she was here. The patient does have an elevated troponin is 0.32 possibly consistent with non-ST elevation myocardial infarction with full resolution of symptoms and significant concerns of further interventional diagnostics due to advanced age glomerular filtration rate and other issues. The patient previously has been on high intensity cholesterol therapy aspirin at this time. She may need further adjustments of medication management  Past Medical History:  Diagnosis Date  . CAD (coronary artery disease)   . Carotid atherosclerosis   . CVA (cerebral vascular accident) (HCC)   . Hypertension       Surgical History:  Past Surgical History:  Procedure Laterality Date  . APPENDECTOMY    . CORONARY ARTERY BYPASS GRAFT    . RETINAL DETACHMENT SURGERY    . THYROID SURGERY       Home Meds: Prior to Admission medications   Medication Sig Start Date End Date Taking? Authorizing Provider  acetaminophen (TYLENOL) 325 MG tablet Take 650 mg by mouth  every 4 (four) hours as needed.   Yes [provider]  aspirin EC 81 MG tablet Take 81 mg by mouth daily.   Yes [provider]  Cholecalciferol (VITAMIN D-1000 MAX ST) 1000 units tablet Take 1,000 Units by mouth daily. 10/29/14  Yes [provider]  gabapentin (NEURONTIN) 100 MG capsule Take 100 mg by mouth at bedtime. 04/18/17  Yes [provider]  hydrochlorothiazide (HYDRODIURIL) 25 MG tablet Take 25 mg by mouth daily. 04/13/17  Yes [provider]  levothyroxine (SYNTHROID, LEVOTHROID) 75 MCG tablet Take 75 mcg by mouth every morning. 07/13/15  Yes [provider]  vitamin B-12 (CYANOCOBALAMIN) 1000 MCG tablet Take 1,000 mcg by mouth daily.   Yes [provider]  carvedilol (COREG) 12.5 MG tablet Take 2 tablets by mouth 2 (two) times daily. 03/20/17   [provider]    Inpatient Medications:  . aspirin  324 mg Oral NOW   Or  . aspirin  300 mg Rectal NOW  . [START ON 05/18/2017] aspirin EC  81 mg Oral Daily  . atorvastatin  20 mg Oral q1800  . enoxaparin (LOVENOX) injection  40 mg Subcutaneous Q24H  . sodium chloride flush  3 mL Intravenous Q12H   . sodium chloride      Allergies:  Allergies  Allergen Reactions  . Phenobarbital     Doesn't remember  . Tramadol     Other reaction(s): Unknown  . Lisinopril Other (See Comments)    dizzy    Social History   Social History  . Marital status: Widowed    Spouse name: N/A  . Number of children: N/A  .  Years of education: N/A   Occupational History  . retired    Social History Main Topics  . Smoking status: Never Smoker  . Smokeless tobacco: Never Used  . Alcohol use No  . Drug use: No  . Sexual activity: No   Other Topics Concern  . Not on file   Social History Narrative  . No narrative on file     Family History  Problem Relation Age of Onset  . Hypertension Neg Hx   . Diabetes Mellitus II Neg Hx      Review of Systems Positive forChest  pain shortness of breath Negative for: General:  chills, fever, night sweats or weight changes.  Cardiovascular: PND orthopnea syncope dizziness  Dermatological skin lesions rashes Respiratory: Cough congestion Urologic: Frequent urination urination at night and hematuria Abdominal: negative for nausea, vomiting, diarrhea, bright red blood per rectum, melena, or hematemesis Neurologic: negative for visual changes, and/or hearing changes  All other systems reviewed and are otherwise negative except as noted above.  Labs:  Recent Labs  05/17/17 0404 05/17/17 0709  TROPONINI <0.03 0.32*   Lab Results  Component Value Date   WBC 5.2 05/17/2017   HGB 12.8 05/17/2017   HCT 37.6 05/17/2017   MCV 87.9 05/17/2017   PLT 164 05/17/2017    Recent Labs Lab 05/17/17 0404  NA 129*  K 3.5  CL 95*  CO2 25  BUN 27*  CREATININE 1.19*  CALCIUM 9.4  PROT 7.2  BILITOT 0.7  ALKPHOS 46  ALT 24  AST 40  GLUCOSE 140*   No results found for: CHOL, HDL, LDLCALC, TRIG No results found for: DDIMER  Radiology/Studies:  Dg Chest Port 1 View  Result Date: 05/17/2017 CLINICAL DATA:  Chest pain, nausea. EXAM: PORTABLE CHEST 1 VIEW COMPARISON:  Chest radiograph March 21, 2010 FINDINGS: Stable cardiomegaly. Moderate hiatal hernia with air-fluid level. Calcified aortic knob. Status post median sternotomy for CABG. Mild chronic interstitial changes without pleural effusion or focal consolidation. No pneumothorax. Osteopenia. Soft tissue planes are nonsuspicious. IMPRESSION: Stable cardiomegaly and chronic interstitial changes. Moderate hiatal hernia. Electronically Signed   By: Awilda Metro M.D.   On: 05/17/2017 04:32    ZOX:WRUEAV sinus rhythm with left axis deviation and left bundle branch block  Weights: Filed Weights   05/17/17 0408  Weight: 66.7 kg (147 lb)     Physical Exam: Blood pressure (!) 128/59, pulse 62, temperature 98.7 F (37.1 C), temperature source Oral, resp. rate 18,  height 5' 5.5" (1.664 m), weight 66.7 kg (147 lb), SpO2 94 %. Body mass index is 24.09 kg/m. General: Well developed, well nourished, in no acute distress. Head eyes ears nose throat: Normocephalic, atraumatic, sclera non-icteric, no xanthomas, nares are without discharge. No apparent thyromegaly and/or mass  Lungs: Normal respiratory effort.Few wheezes, no rales, no rhonchi.  Heart: RRR with normal S1 S2. no murmur gallop, no rub, PMI is normal size and placement, carotid upstroke normal without bruit, jugular venous pressure is normal Abdomen: Soft, non-tender, non-distended with normoactive bowel sounds. No hepatomegaly. No rebound/guarding. No obvious abdominal masses. Abdominal aorta is normal size without bruit Extremities:Trace edema. no cyanosis, no clubbing, no ulcers  Peripheral : 2+ bilateral upper extremity pulses, 2+ bilateral femoral pulses, 2+ bilateral dorsal pedal pulse Neuro: Alert and oriented. No facial asymmetry. No focal deficit. Moves all extremities spontaneously. Musculoskeletal: Normal muscle tone without kyphosis Psych:  Responds to questions appropriately with a normal affect.    Assessment: 83 year old female with known coronary artery  disease status post coronary bypass graft valvular heart disease essential hypertension mixed hyperlipidemia chronic kidney disease stage III with chest pain possibly consistent with minimal non-ST elevation myocardial infarction  Plan: 1. Continue high intensity cholesterol therapy 2. Aspirin for further risk reduction in cardiovascular event 3. Consider Plavix if able for dual antiplatelet therapy for non-ST elevation myocardial infarction 4. No use of beta blocker due to significant bradycardia 5. Isosorbide for further risk reduction of chest discomfort 6. Begin ambulation and uses cardiac rehabilitation for further assessment and treatment and possible discharge to home tomorrow if doing well  Signed, Lamar Blinks M.D.  [facility] Cardiology 05/17/2017, 1:13 PM

## 2017-05-17 NOTE — Progress Notes (Signed)
Admitted this morning for chest pain, elevated troponins. Found to have a non-ST elevation MI. Troponin up to 0.32. Seen by cardiology.EKG not changed from before which showed left bundle branch block.patient's nephew is at the bedside.  Labs reviewed. Patient's second troponin this morning 0.32. Vitals; Stable; Physical examination: Alert, awake, oriented. Cardiovascular: S1, S2 regular. : Lungs: Clear to auscultation, no wheeze, no rales. Neurologically: Patient alert, awake, oriented.  Assessment and plan: 1. Coronary artery disease with bypass surgery comes in with chest pain and found to have non-ST elevation MI: Patient seen by cardiology, continue aspirin, and Plavix also. We'll check 1 more set of troponin. Echocardiogram showed EF more than 45%. Ambulate todayand likely discharge tomorrow monitored on telemetry today.unable to his beta blockers because of bradycardia. Continue Imdur. #d,iscussed with nephew. Time;25 min

## 2017-05-17 NOTE — Progress Notes (Signed)
Troponin .32 Dr. Gwen Pounds notified. No orders received.

## 2017-05-17 NOTE — Care Management Obs Status (Signed)
MEDICARE OBSERVATION STATUS NOTIFICATION   Patient Details  Name: Gabriella Hawkins MRN: [id number] Date of Birth: October 24, 1919   Medicare Observation Status Notification Given:  Yes    Marily Memos, RN 05/17/2017, 12:08 PM

## 2017-05-17 NOTE — ED Triage Notes (Signed)
Pt to the ER for chest pain, nausea. Pt woke up from sleep hurting all across her chest. No diaphoresis or SOB.

## 2017-05-17 NOTE — Progress Notes (Signed)
*  PRELIMINARY RESULTS* Echocardiogram 2D Echocardiogram has been performed.  Cristela Blue 05/17/2017, 9:24 AM

## 2017-05-17 NOTE — ED Provider Notes (Signed)
[facility] Emergency Department Provider Note   ____________________________________________   First MD Initiated Contact with Patient 05/17/17 0405     (approximate)  I have reviewed the triage vital signs and the nursing notes.   HISTORY  Chief Complaint Chest Pain    HPI Gabriella Hawkins is a 83 y.o. female brought to the ED from home via EMS with a chief complaint of chest pain. Patient has a history of CAD status post CABG who awoke from sleep with chest pressure and associated nausea. Denies associated diaphoresis or shortness of breath.denies recent fever, chills, abdominal pain, vomiting, diarrhea. Denies recent travel or trauma. EMS administered aspirin prior to arrival.   Past medical history . Carotid atherosclerosis  s/p right endarectomy  . Coronary atherosclerosis of autologous vein bypass graft  . CRAO (central retinal artery occlusion), right 11/03/2015  2008  . CVA (cerebral vascular accident) (CMS-HCC)  . Epiretinal membrane (ERM) of left eye 11/03/2015  S/P MEMBRANE PEEL 11/2014 (by Dr. Duke Salvia)  . HTN (hypertension)  . Pure hypercholesterolemia  . Restless leg syndrome  . Valvular heart disease   Patient Active Problem List   Diagnosis Date Noted  . Chest pain 05/17/2017    Past surgical history Coronary artery bypass graft; middle finger surgery (Right); Appendectomy; lens eye surgery (Bilateral); and vitreous retinal surgery (Left, 11/2014).    Prior to Admission medications   Medication Sig Start Date End Date Taking? Authorizing Provider  acetaminophen (TYLENOL) 325 MG tablet Take 650 mg by mouth every 4 (four) hours as needed.   Yes [provider]  aspirin EC 81 MG tablet Take 81 mg by mouth daily.   Yes [provider]  Cholecalciferol (VITAMIN D-1000 MAX ST) 1000 units tablet Take 1,000 Units by mouth daily. 10/29/14  Yes [provider]  gabapentin (NEURONTIN) 100 MG capsule  Take 100 mg by mouth at bedtime. 04/18/17  Yes [provider]  hydrochlorothiazide (HYDRODIURIL) 25 MG tablet Take 25 mg by mouth daily. 04/13/17  Yes [provider]  levothyroxine (SYNTHROID, LEVOTHROID) 75 MCG tablet Take 75 mcg by mouth every morning. 07/13/15  Yes [provider]  vitamin B-12 (CYANOCOBALAMIN) 1000 MCG tablet Take 1,000 mcg by mouth daily.   Yes [provider]  carvedilol (COREG) 12.5 MG tablet Take 2 tablets by mouth 2 (two) times daily. 03/20/17   [provider]    Allergies Phenobarbital; Tramadol; and Lisinopril  Family History  Problem Relation Age of Onset  . Hypertension Neg Hx   . Diabetes Mellitus II Neg Hx     Social History Social History  Substance Use Topics  . Smoking status: Never Smoker  . Smokeless tobacco: Never Used  . Alcohol use No  Nonsmoker  Review of Systems  Constitutional: No fever/chills. Eyes: No visual changes. ENT: No sore throat. Cardiovascular: positive for chest pain. Respiratory: Denies shortness of breath. Gastrointestinal: No abdominal pain.  positive for nausea, no vomiting.  No diarrhea.  No constipation. Genitourinary: Negative for dysuria. Musculoskeletal: Negative for back pain. Skin: Negative for rash. Neurological: Negative for headaches, focal weakness or numbness.   ____________________________________________   PHYSICAL EXAM:  VITAL SIGNS: ED Triage Vitals  Enc Vitals Group     BP --      Pulse --      Resp --      Temp --      Temp src --      SpO2 05/17/17 0403 92 %  Weight 05/17/17 0408 147 lb (66.7 kg)     Height 05/17/17 0408 5' 5.5" (1.664 m)     Head Circumference --      Peak Flow --      Pain Score 05/17/17 0408 8     Pain Loc --      Pain Edu? --      Excl. in GC? --     Constitutional: Alert and oriented. Well appearing and in mild acute distress. Eyes: Conjunctivae are normal. PERRL. EOMI. Head: Atraumatic. Nose: No  congestion/rhinnorhea. Mouth/Throat: Mucous membranes are moist.  Oropharynx non-erythematous. Neck: No stridor.   Cardiovascular: Normal rate, regular rhythm. Grossly normal heart sounds.  Good peripheral circulation. Respiratory: Normal respiratory effort.  No retractions. Lungs CTAB. Gastrointestinal: Soft and nontender. No distention. No abdominal bruits. No CVA tenderness. Musculoskeletal: No lower extremity tenderness nor edema.  No joint effusions. Neurologic:  Normal speech and language. No gross focal neurologic deficits are appreciated.  Skin:  Skin is warm, dry and intact. No rash noted. Psychiatric: Mood and affect are normal. Speech and behavior are normal.  ____________________________________________   LABS (all labs ordered are listed, but only abnormal results are displayed)  Labs Reviewed  CBC WITH DIFFERENTIAL/PLATELET - Abnormal; Notable for the following:       Result Value   RDW 14.6 (*)    All other components within normal limits  COMPREHENSIVE METABOLIC PANEL - Abnormal; Notable for the following:    Sodium 129 (*)    Chloride 95 (*)    Glucose, Bld 140 (*)    BUN 27 (*)    Creatinine, Ser 1.19 (*)    GFR calc non Af Amer 38 (*)    GFR calc Af Amer 44 (*)    All other components within normal limits  BRAIN NATRIURETIC PEPTIDE - Abnormal; Notable for the following:    B Natriuretic Peptide 184.0 (*)    All other components within normal limits  TROPONIN I  CBC   ____________________________________________  EKG  ED ECG REPORT I, Abril Cappiello J, the attending physician, personally viewed and interpreted this ECG.   Date: 05/17/2017  EKG Time: 0404  Rate: 77  Rhythm: normal EKG, normal sinus rhythm  Axis: LAD  Intervals:left bundle branch block  ST&T Change: nonspecific no significant change from 04/2014 ____________________________________________  RADIOLOGY  Dg Chest Port 1 View  Result Date: 05/17/2017 CLINICAL DATA:  Chest pain, nausea.  EXAM: PORTABLE CHEST 1 VIEW COMPARISON:  Chest radiograph March 21, 2010 FINDINGS: Stable cardiomegaly. Moderate hiatal hernia with air-fluid level. Calcified aortic knob. Status post median sternotomy for CABG. Mild chronic interstitial changes without pleural effusion or focal consolidation. No pneumothorax. Osteopenia. Soft tissue planes are nonsuspicious. IMPRESSION: Stable cardiomegaly and chronic interstitial changes. Moderate hiatal hernia. Electronically Signed   By: Awilda Metro M.D.   On: 05/17/2017 04:32    ____________________________________________   PROCEDURES  Procedure(s) performed: None  Procedures  Critical Care performed:   CRITICAL CARE Performed by: Irean Hong   Total critical care time: 30 minutes  Critical care time was exclusive of separately billable procedures and treating other patients.  Critical care was necessary to treat or prevent imminent or life-threatening deterioration.  Critical care was time spent personally by me on the following activities: development of treatment plan with patient and/or surrogate as well as nursing, discussions with consultants, evaluation of patient's response to treatment, examination of patient, obtaining history from patient or surrogate, ordering and performing treatments and interventions, ordering and  review of laboratory studies, ordering and review of radiographic studies, pulse oximetry and re-evaluation of patient's condition.  ____________________________________________   INITIAL IMPRESSION / ASSESSMENT AND PLAN / ED COURSE    83 year old female with a history of CAD status post CABG who was awakened with chest pressure with associated nausea. Differential diagnosis includes, but is not limited to, ACS, aortic dissection, pulmonary embolism, cardiac tamponade, pneumothorax, pneumonia, pericarditis/myocarditis, GI-related causes including esophagitis/gastritis, and musculoskeletal chest wall pain.   Patient was given aspirin prior to arrival by EMS. Will apply nitroglycerin paste, obtain screening lab work including troponin, chest x-ray and reassess.  Clinical Course as of May 17 541  Wed May 17, 2017  0502 Pain improved after nitroglycerin paste. Updated patient of laboratory imaging results. Will discuss with hospitalist to evaluate patient in the emergency department for admission.  [JS]    Clinical Course User Index [JS] Irean Hong, MD     ____________________________________________   FINAL CLINICAL IMPRESSION(S) / ED DIAGNOSES  Final diagnoses:  Chest pain, unspecified type  Unstable angina [facility])  Essential hypertension      NEW MEDICATIONS STARTED DURING THIS VISIT:  New Prescriptions   No medications on file     Note:  This document was prepared using Dragon voice recognition software and may include unintentional dictation errors.    Irean Hong, MD 05/17/17 [phone]

## 2017-05-18 LAB — LIPID PANEL
CHOL/HDL RATIO: 4 ratio
CHOLESTEROL: 195 mg/dL (ref 0–200)
HDL: 49 mg/dL (ref >40)
LDL Cholesterol: 126 mg/dL — ABNORMAL HIGH (ref 0–99)
TRIGLYCERIDES: 98 mg/dL (ref <150)
VLDL: 20 mg/dL (ref 0–40)

## 2017-05-18 LAB — BASIC METABOLIC PANEL
ANION GAP: 9 (ref 5–15)
BUN: 24 mg/dL — ABNORMAL HIGH (ref 6–20)
CHLORIDE: 97 mmol/L — AB (ref 101–111)
CO2: 27 mmol/L (ref 22–32)
Calcium: 9 mg/dL (ref 8.9–10.3)
Creatinine, Ser: 1.19 mg/dL — ABNORMAL HIGH (ref 0.44–1.00)
GFR, EST AFRICAN AMERICAN: 44 mL/min — AB (ref >60)
GFR, EST NON AFRICAN AMERICAN: 38 mL/min — AB (ref >60)
Glucose, Bld: 96 mg/dL (ref 65–99)
POTASSIUM: 3.7 mmol/L (ref 3.5–5.1)
SODIUM: 133 mmol/L — AB (ref 135–145)

## 2017-05-18 LAB — CBC
HCT: 35.5 % (ref 35.0–47.0)
Hemoglobin: 12.1 g/dL (ref 12.0–16.0)
MCH: 29.2 pg (ref 26.0–34.0)
MCHC: 34 g/dL (ref 32.0–36.0)
MCV: 85.9 fL (ref 80.0–100.0)
PLATELETS: 150 10*3/uL (ref 150–440)
RBC: 4.13 MIL/uL (ref 3.80–5.20)
RDW: 14.5 % (ref 11.5–14.5)
WBC: 4.2 10*3/uL (ref 3.6–11.0)

## 2017-05-18 MED ORDER — AMLODIPINE BESYLATE 5 MG PO TABS
5.0000 mg | ORAL_TABLET | Freq: Every day | ORAL | Status: DC
  Administered 2017-05-18: 5 mg via ORAL
  Filled 2017-05-18: qty 1

## 2017-05-18 MED ORDER — ISOSORBIDE MONONITRATE ER 30 MG PO TB24
30.0000 mg | ORAL_TABLET | Freq: Every day | ORAL | Status: DC
  Administered 2017-05-18: 30 mg via ORAL
  Filled 2017-05-18: qty 1

## 2017-05-18 MED ORDER — METOPROLOL TARTRATE 25 MG PO TABS: 12.5000 mg | ORAL_TABLET | Freq: Two times a day (BID) | ORAL | 0 refills | Status: AC

## 2017-05-18 MED ORDER — ISOSORBIDE MONONITRATE ER 30 MG PO TB24: 30.0000 mg | ORAL_TABLET | Freq: Every day | ORAL | 0 refills | Status: DC

## 2017-05-18 MED ORDER — ATORVASTATIN CALCIUM 20 MG PO TABS: 20.0000 mg | ORAL_TABLET | Freq: Every day | ORAL | 0 refills | Status: AC

## 2017-05-18 MED ORDER — CLOPIDOGREL BISULFATE 75 MG PO TABS: 75.0000 mg | ORAL_TABLET | Freq: Every day | ORAL | 0 refills | Status: AC

## 2017-05-18 MED ORDER — METOPROLOL TARTRATE 25 MG PO TABS
12.5000 mg | ORAL_TABLET | Freq: Two times a day (BID) | ORAL | Status: DC
  Administered 2017-05-18: 12.5 mg via ORAL
  Filled 2017-05-18: qty 1

## 2017-05-18 NOTE — Progress Notes (Signed)
[facility] Cardiology [facility] Encounter Note  Patient: Gabriella Hawkins / Admit Date: 05/17/2017 / Date of Encounter: 05/18/2017, 8:06 AM   Subjective: Patient has no further chest pain since admission. Patient ambulating well without evidence of significant new chest pain or shortness of breath. Patient does have full elevation of troponin .98 consistent with non-ST elevation myocardial infarction Echocardiogram showing normal LV systolic function with some valvular heart disease  Review of Systems: Positive for: Shortness of breath   Negative for: Vision change, hearing change, syncope, dizziness, nausea, vomiting,diarrhea, bloody stool, stomach pain, cough, congestion, diaphoresis, urinary frequency, urinary pain,skin lesions, skin rashes Others previously listed  Objective: Telemetry: Normal sinus rhythm Physical Exam: Blood pressure (!) 156/75, pulse 63, temperature 97.8 F (36.6 C), resp. rate 18, height 5' 5.5" (1.664 m), weight 66.7 kg (147 lb), SpO2 97 %. Body mass index is 24.09 kg/m. General: Well developed, well nourished, in no acute distress. Head: Normocephalic, atraumatic, sclera non-icteric, no xanthomas, nares are without discharge. Neck: No apparent masses Lungs: Normal respirations with no wheezes, no rhonchi, no rales , no crackles   Heart: Regular rate and rhythm, normal S1 S2, 2+ right upper sternal border murmur, no rub, no gallop, PMI is normal size and placement, carotid upstroke normal without bruit, jugular venous pressure normal Abdomen: Soft, non-tender, non-distended with normoactive bowel sounds. No hepatosplenomegaly. Abdominal aorta is normal size without bruit Extremities: Trace edema, no clubbing, no cyanosis, no ulcers,  Peripheral: 2+ radial, 2+ femoral, 2+ dorsal pedal pulses Neuro: Alert and oriented. Moves all extremities spontaneously. Psych:  Responds to questions appropriately with a normal affect.   Intake/Output Summary (Last  24 hours) at 05/18/17 0806 Last data filed at 05/18/17 0206  Gross per 24 hour  Intake              720 ml  Output              500 ml  Net              220 ml    Inpatient Medications:  . amLODipine  5 mg Oral Daily  . aspirin EC  81 mg Oral Daily  . atorvastatin  20 mg Oral q1800  . clopidogrel  75 mg Oral Daily  . enoxaparin (LOVENOX) injection  40 mg Subcutaneous Q24H  . gabapentin  100 mg Oral QHS  . isosorbide mononitrate  30 mg Oral Daily  . levothyroxine  75 mcg Oral BH-q7a  . metoprolol tartrate  12.5 mg Oral BID  . sodium chloride flush  3 mL Intravenous Q12H   Infusions:  . sodium chloride      Labs:  Recent Labs  05/17/17 0404 05/18/17 0535  NA 129* 133*  K 3.5 3.7  CL 95* 97*  CO2 25 27  GLUCOSE 140* 96  BUN 27* 24*  CREATININE 1.19* 1.19*  CALCIUM 9.4 9.0    Recent Labs  05/17/17 0404  AST 40  ALT 24  ALKPHOS 46  BILITOT 0.7  PROT 7.2  ALBUMIN 4.0    Recent Labs  05/17/17 0404 05/18/17 0535  WBC 5.2 4.2  NEUTROABS 2.6  --   HGB 12.8 12.1  HCT 37.6 35.5  MCV 87.9 85.9  PLT 164 150    Recent Labs  05/17/17 0404 05/17/17 0709 05/17/17 1341 05/17/17 1919  TROPONINI <0.03 0.32* 0.94* 0.98*   Invalid input(s): POCBNP No results for input(s): HGBA1C in the last 72 hours.   Weights: [employer]   05/17/17  0408  Weight: 66.7 kg (147 lb)     Radiology/Studies:  Dg Chest Port 1 View  Result Date: 05/17/2017 CLINICAL DATA:  Chest pain, nausea. EXAM: PORTABLE CHEST 1 VIEW COMPARISON:  Chest radiograph March 21, 2010 FINDINGS: Stable cardiomegaly. Moderate hiatal hernia with air-fluid level. Calcified aortic knob. Status post median sternotomy for CABG. Mild chronic interstitial changes without pleural effusion or focal consolidation. No pneumothorax. Osteopenia. Soft tissue planes are nonsuspicious. IMPRESSION: Stable cardiomegaly and chronic interstitial changes. Moderate hiatal hernia. Electronically Signed   By: Awilda Metro M.D.   On: 05/17/2017 04:32     Assessment and Recommendation  83 y.o. female with known coronary artery disease status post coronary bypass graft in the past with the significant mitral and tricuspid regurgitation essential hypertension mixed hyperlipidemia chronic kidney disease stage III with severe chest pain and consistent with non-ST elevation myocardial infarction now having improvements of symptoms with appropriate medication management 1. Dual antiplatelet therapy for non-ST elevation myocardial infarction 2. Low-dose beta blocker watching closely for significant bradycardia and for further risk reduction in symptoms 3. Isosorbide for possible myocardial ischemia and myocardial infarction 4. Possible use of calcium channel blocker for hypertension control for further risk reduction cardiovascular event and help with chest pain 5. Begin ambulation and start cardiac rehabilitation 6. If ambulating well without further significant symptoms possible discharge to home with follow-up in one week  Signed, Arnoldo Hooker M.D. FACC

## 2017-05-18 NOTE — Progress Notes (Signed)
Patient is discharge home in a stable condition, summary and f/u care given, verbalized understanding . 

## 2017-05-23 NOTE — Discharge Summary (Signed)
Gabriella Hawkins, is a 83 y.o. female  DOB 1920/04/30  MRN [id number].  Admission date:  05/17/2017  Admitting Physician  Ihor Austin, MD  Discharge Date:  05/18/2017   Primary MD  Sula Rumple, MD  Recommendations for primary care physician for things to follow:  Follow with PCP in one week   Admission Diagnosis  Unstable angina (HCC) [I20.0] Essential hypertension [I10] Chest pain, unspecified type [R07.9]   Discharge Diagnosis  Unstable angina (HCC) [I20.0] Essential hypertension [I10] Chest pain, unspecified type [R07.9]    Active Problems:   Chest pain      Past Medical History:  Diagnosis Date  . CAD (coronary artery disease)   . Carotid atherosclerosis   . CVA (cerebral vascular accident) (HCC)   . Hypertension     Past Surgical History:  Procedure Laterality Date  . APPENDECTOMY    . CORONARY ARTERY BYPASS GRAFT    . RETINAL DETACHMENT SURGERY    . THYROID SURGERY         History of present illness and  Hospital Course:     Kindly see H&P for history of present illness and admission details, please review complete Labs, Consult reports and Test reports for all details in brief  HPI  from the history and physical done on the day of admission  83 y.o. female with a known history of Carotid artery disease, carotid atherosclerosis, CVA, hypertension, hyperlipidemia, restless leg syndrome, valvular heart disease presented to the emergency room with chest pain. . The pain was intense and sharp in nature 6 out of 10 on a scale of 1-10. No complaints of shortness of breath, orthopnea. Patient was evaluated in the emergency room troponin was negative. EKG showed old left bundle branch block. admittted to telemetry.   Hospital Course   1. Coronary artery disease with bypass surgery comes in  with chest pain and found to have non-ST elevation MI: Patient seen by cardiology, continue aspirin, and Plavix also. We'll check 1 more set of troponin. Echocardiogram showed EF more than 45%.continue Imdur.she is on metoprolol 12,5mg  po BID.seen by cardiology DR.kowalski,discharged home in stable condition..pt is under stress because her twin sister passed way few day ago ,and she her nephew thinks it is due to stress ,she had chest pain.   Discharge Condition: stable   Follow UP Follow up with [facility] as scheduled.   2.Hypothyroidsim;continue levothyroxine 75 mcg po daily.  Discharge Instructions  and  Discharge Medications     Allergies as of 05/18/2017      Reactions   Phenobarbital    Doesn't remember   Tramadol    Other reaction(s): Unknown   Lisinopril Other (See Comments)   dizzy      Medication List    STOP taking these medications   carvedilol 12.5 MG tablet Commonly known as:  COREG     TAKE these medications   acetaminophen 325 MG tablet Commonly known as:  TYLENOL Take 650 mg by mouth every 4 (four) hours as needed.   aspirin EC 81 MG tablet Take 81 mg by mouth daily.   atorvastatin 20 MG tablet Commonly known as:  LIPITOR Take 1 tablet (20 mg total) by mouth daily at 6 PM.   clopidogrel 75 MG tablet Commonly known as:  PLAVIX Take 1 tablet (75 mg total) by mouth daily.   gabapentin 100 MG capsule Commonly known as:  NEURONTIN Take 100 mg by mouth at bedtime.   hydrochlorothiazide 25 MG tablet Commonly known  as:  HYDRODIURIL Take 25 mg by mouth daily.   isosorbide mononitrate 30 MG 24 hr tablet Commonly known as:  IMDUR Take 1 tablet (30 mg total) by mouth daily.   levothyroxine 75 MCG tablet Commonly known as:  SYNTHROID, LEVOTHROID Take 75 mcg by mouth every morning.   metoprolol tartrate 25 MG tablet Commonly known as:  LOPRESSOR Take 0.5 tablets (12.5 mg total) by mouth 2 (two) times daily.   vitamin B-12 1000 MCG  tablet Commonly known as:  CYANOCOBALAMIN Take 1,000 mcg by mouth daily.   VITAMIN D-1000 MAX ST 1000 units tablet Generic drug:  Cholecalciferol Take 1,000 Units by mouth daily.         Diet and Activity recommendation: See Discharge Instructions above   Consults obtained - cardiology   Major procedures and Radiology Reports - PLEASE review detailed and final reports for all details, in brief -     Dg Chest Port 1 View  Result Date: 05/17/2017 CLINICAL DATA:  Chest pain, nausea. EXAM: PORTABLE CHEST 1 VIEW COMPARISON:  Chest radiograph March 21, 2010 FINDINGS: Stable cardiomegaly. Moderate hiatal hernia with air-fluid level. Calcified aortic knob. Status post median sternotomy for CABG. Mild chronic interstitial changes without pleural effusion or focal consolidation. No pneumothorax. Osteopenia. Soft tissue planes are nonsuspicious. IMPRESSION: Stable cardiomegaly and chronic interstitial changes. Moderate hiatal hernia. Electronically Signed   By: Awilda Metro M.D.   On: 05/17/2017 04:32    Micro Results     No results found for this or any previous visit (from the past 240 hour(s)).     Today   Subjective:   Gabriella Hawkins today has no headache,no chest abdominal pain,no new weakness tingling or numbness, feels much better wants to go home today.   Objective:   Blood pressure (!) 161/68, pulse (!) 57, temperature 97.6 F (36.4 C), temperature source Oral, resp. rate 18, height 5' 5.5" (1.664 m), weight 66.7 kg (147 lb), SpO2 98 %.  No intake or output data in the 24 hours ending 05/23/17 1143  Exam Awake Alert, Oriented x 3, No new F.N deficits, Normal affect [place].AT,PERRAL Supple Neck,No JVD, No cervical lymphadenopathy appriciated.  Symmetrical Chest wall movement, Good air movement bilaterally, CTAB RRR,No Gallops,Rubs or new Murmurs, No Parasternal Heave +ve B.Sounds, Abd Soft, Non tender, No organomegaly appriciated, No rebound -guarding or  rigidity. No Cyanosis, Clubbing or edema, No new Rash or bruise  Data Review   CBC w Diff:  Lab Results  Component Value Date   WBC 4.2 05/18/2017   HGB 12.1 05/18/2017   HGB 13.4 04/22/2014   HCT 35.5 05/18/2017   HCT 42.1 04/22/2014   PLT 150 05/18/2017   PLT 182 04/22/2014   LYMPHOPCT 35 05/17/2017   LYMPHOPCT 26.6 04/22/2014   MONOPCT 12 05/17/2017   MONOPCT 8.8 04/22/2014   EOSPCT 3 05/17/2017   EOSPCT 1.6 04/22/2014   BASOPCT 1 05/17/2017   BASOPCT 0.7 04/22/2014    CMP:  Lab Results  Component Value Date   NA 133 (L) 05/18/2017   NA 133 (L) 04/22/2014   K 3.7 05/18/2017   K 4.3 04/22/2014   CL 97 (L) 05/18/2017   CL 100 04/22/2014   CO2 27 05/18/2017   CO2 26 04/22/2014   BUN 24 (H) 05/18/2017   BUN 13 04/22/2014   CREATININE 1.19 (H) 05/18/2017   CREATININE 1.40 (H) 04/22/2014   PROT 7.2 05/17/2017   ALBUMIN 4.0 05/17/2017   BILITOT 0.7 05/17/2017   ALKPHOS 46  05/17/2017   AST 40 05/17/2017   ALT 24 05/17/2017  .   Total Time in preparing paper work, data evaluation and todays exam - 35 minutes  Ariyan Sinnett M.D on 05/18/2017 at 11:43 AM    Note: This dictation was prepared with Dragon dictation along with smaller phrase technology. Any transcriptional errors that result from this process are unintentional.

## 2017-08-10 ENCOUNTER — Other Ambulatory Visit
Admission: RE | Admit: 2017-08-10 | Discharge: 2017-08-10 | Disposition: A | Discharge status: Home / Self Care | Payer: Medicare Other | Source: Ambulatory Visit | Attending: Otolaryngology | Admitting: Otolaryngology

## 2017-08-10 DIAGNOSIS — E890 Postprocedural hypothyroidism: Secondary | ICD-10-CM | POA: Diagnosis present

## 2017-08-10 LAB — TSH: TSH: 1.378 u[IU]/mL (ref 0.350–4.500)

## 2017-08-10 LAB — T4, FREE: FREE T4: 1.17 ng/dL — AB (ref 0.61–1.12)

## 2017-08-11 LAB — T3, FREE: T3, Free: 2 pg/mL (ref 2.0–4.4)

## 2018-02-22 ENCOUNTER — Emergency Department
Admission: EM | Admit: 2018-02-22 | Discharge: 2018-02-22 | Disposition: A | Discharge status: Home / Self Care | Payer: Medicare Other | Attending: Emergency Medicine | Admitting: Emergency Medicine

## 2018-02-22 ENCOUNTER — Emergency Department: Payer: Medicare Other

## 2018-02-22 ENCOUNTER — Other Ambulatory Visit: Payer: Self-pay

## 2018-02-22 DIAGNOSIS — Z7902 Long term (current) use of antithrombotics/antiplatelets: Secondary | ICD-10-CM | POA: Insufficient documentation

## 2018-02-22 DIAGNOSIS — I2510 Atherosclerotic heart disease of native coronary artery without angina pectoris: Secondary | ICD-10-CM | POA: Insufficient documentation

## 2018-02-22 DIAGNOSIS — Z7982 Long term (current) use of aspirin: Secondary | ICD-10-CM | POA: Diagnosis not present

## 2018-02-22 DIAGNOSIS — I10 Essential (primary) hypertension: Secondary | ICD-10-CM | POA: Diagnosis present

## 2018-02-22 DIAGNOSIS — Z951 Presence of aortocoronary bypass graft: Secondary | ICD-10-CM | POA: Insufficient documentation

## 2018-02-22 LAB — CBC WITH DIFFERENTIAL/PLATELET
BASOS PCT: 1 %
Basophils Absolute: 0 10*3/uL (ref 0–0.1)
EOS ABS: 0.1 10*3/uL (ref 0–0.7)
EOS PCT: 4 %
HEMATOCRIT: 39.7 % (ref 35.0–47.0)
Hemoglobin: 13.4 g/dL (ref 12.0–16.0)
Lymphocytes Relative: 31 %
Lymphs Abs: 1.3 10*3/uL (ref 1.0–3.6)
MCH: 29.8 pg (ref 26.0–34.0)
MCHC: 33.8 g/dL (ref 32.0–36.0)
MCV: 88.1 fL (ref 80.0–100.0)
MONO ABS: 0.4 10*3/uL (ref 0.2–0.9)
Monocytes Relative: 10 %
Neutro Abs: 2.2 10*3/uL (ref 1.4–6.5)
Neutrophils Relative %: 54 %
PLATELETS: 156 10*3/uL (ref 150–440)
RBC: 4.5 MIL/uL (ref 3.80–5.20)
RDW: 14.5 % (ref 11.5–14.5)
WBC: 4 10*3/uL (ref 3.6–11.0)

## 2018-02-22 LAB — URINALYSIS, COMPLETE (UACMP) WITH MICROSCOPIC
Bilirubin Urine: NEGATIVE
Glucose, UA: NEGATIVE mg/dL
Hgb urine dipstick: NEGATIVE
Ketones, ur: NEGATIVE mg/dL
Nitrite: NEGATIVE
Protein, ur: NEGATIVE mg/dL
SPECIFIC GRAVITY, URINE: 1.004 — AB (ref 1.005–1.030)
Squamous Epithelial / LPF: NONE SEEN (ref 0–5)
pH: 8 (ref 5.0–8.0)

## 2018-02-22 LAB — BASIC METABOLIC PANEL
Anion gap: 8 (ref 5–15)
BUN: 18 mg/dL (ref 8–23)
CALCIUM: 9.2 mg/dL (ref 8.9–10.3)
CO2: 26 mmol/L (ref 22–32)
CREATININE: 1.22 mg/dL — AB (ref 0.44–1.00)
Chloride: 100 mmol/L (ref 98–111)
GFR calc Af Amer: 42 mL/min — ABNORMAL LOW (ref >60)
GFR calc non Af Amer: 36 mL/min — ABNORMAL LOW (ref >60)
GLUCOSE: 99 mg/dL (ref 70–99)
Potassium: 4.1 mmol/L (ref 3.5–5.1)
Sodium: 134 mmol/L — ABNORMAL LOW (ref 135–145)

## 2018-02-22 LAB — TROPONIN I

## 2018-02-22 NOTE — ED Provider Notes (Signed)
[facility] Emergency Department Provider Note ____________________________________________   First MD Initiated Contact with Patient 02/22/18 [phone]     (approximate)  I have reviewed the triage vital signs and the nursing notes.   HISTORY  Chief Complaint Hypertension    HPI Majesty Stehlin is a 84 y.o. female with PMH as noted below who presents with elevated blood pressure, with readings as high as 200/90.  Patient states that she just feels "bad" as if she does not have energy, and reports that she felt shaky this morning.  She denies any other specific acute symptoms.  She denies chest pain.  She reports some difficulty breathing, but states this is chronic.  She also reports some chronic left shoulder and ankle pain with no acute injury, and chronic constipation.  She states that her doctor started her on a new blood pressure medication yesterday, and that she took it last night and this morning.  Past Medical History:  Diagnosis Date  . CAD (coronary artery disease)   . Carotid atherosclerosis   . CVA (cerebral vascular accident) (HCC)   . Hypertension     Patient Active Problem List   Diagnosis Date Noted  . Chest pain 05/17/2017    Past Surgical History:  Procedure Laterality Date  . APPENDECTOMY    . CORONARY ARTERY BYPASS GRAFT    . RETINAL DETACHMENT SURGERY    . THYROID SURGERY      Prior to Admission medications   Medication Sig Start Date End Date Taking? Authorizing Provider  acetaminophen (TYLENOL) 325 MG tablet Take 650 mg by mouth every 4 (four) hours as needed.    [provider]  aspirin EC 81 MG tablet Take 81 mg by mouth daily.    [provider]  atorvastatin (LIPITOR) 20 MG tablet Take 1 tablet (20 mg total) by mouth daily at 6 PM. 05/18/17   Katha Hamming, MD  Cholecalciferol (VITAMIN D-1000 MAX ST) 1000 units tablet Take 1,000 Units by mouth daily. 10/29/14   [provider]    clopidogrel (PLAVIX) 75 MG tablet Take 1 tablet (75 mg total) by mouth daily. 05/19/17   Katha Hamming, MD  gabapentin (NEURONTIN) 100 MG capsule Take 100 mg by mouth at bedtime. 04/18/17   [provider]  hydrochlorothiazide (HYDRODIURIL) 25 MG tablet Take 25 mg by mouth daily. 04/13/17   [provider]  isosorbide mononitrate (IMDUR) 30 MG 24 hr tablet Take 1 tablet (30 mg total) by mouth daily. 05/19/17   Katha Hamming, MD  levothyroxine (SYNTHROID, LEVOTHROID) 75 MCG tablet Take 75 mcg by mouth every morning. 07/13/15   [provider]  metoprolol tartrate (LOPRESSOR) 25 MG tablet Take 0.5 tablets (12.5 mg total) by mouth 2 (two) times daily. 05/18/17   Katha Hamming, MD  vitamin B-12 (CYANOCOBALAMIN) 1000 MCG tablet Take 1,000 mcg by mouth daily.    [provider]    Allergies Phenobarbital; Tramadol; and Lisinopril  Family History  Problem Relation Age of Onset  . Hypertension Neg Hx   . Diabetes Mellitus II Neg Hx     Social History Social History   Tobacco Use  . Smoking status: Never Smoker  . Smokeless tobacco: Never Used  Substance Use Topics  . Alcohol use: No  . Drug use: No    Review of Systems  Constitutional: No fever. Eyes: No redness. ENT: No neck pain. Cardiovascular: Denies chest pain. Respiratory: Positive for mild chronic shortness of breath. Gastrointestinal: No vomiting. Genitourinary:  Negative for dysuria.  Musculoskeletal: Negative for back pain. Skin: Negative for rash. Neurological: Negative for headache.   ____________________________________________   PHYSICAL EXAM:  VITAL SIGNS: ED Triage Vitals  Enc Vitals Group     BP 02/22/18 0657 (!) 196/88     Pulse Rate 02/22/18 0657 67     Resp 02/22/18 0657 14     Temp 02/22/18 0657 97.6 F (36.4 C)     Temp Source 02/22/18 0657 Oral     SpO2 02/22/18 0657 96 %     Weight 02/22/18 0658 142 lb (64.4 kg)     Height 02/22/18 0658 5'  5" (1.651 m)     Head Circumference --      Peak Flow --      Pain Score 02/22/18 0658 0     Pain Loc --      Pain Edu? --      Excl. in GC? --     Constitutional: Alert and oriented.  Relatively well appearing and in no acute distress. Eyes: Conjunctivae are normal.  EOMI. Head: Atraumatic. Nose: No congestion/rhinnorhea. Mouth/Throat: Mucous membranes are moist.   Neck: Normal range of motion.  Cardiovascular: Normal rate, regular rhythm. Grossly normal heart sounds.  Good peripheral circulation. Respiratory: Normal respiratory effort.  No retractions. Lungs CTAB. Gastrointestinal: No distention.  Musculoskeletal: No lower extremity edema.  Extremities warm and well perfused.  Left shoulder and left ankle with FROM, no deformity, no significant tenderness. Neurologic:  Normal speech and language. No gross focal neurologic deficits are appreciated.  Skin:  Skin is warm and dry. No rash noted. Psychiatric: Mood and affect are normal. Speech and behavior are normal.  ____________________________________________   LABS (all labs ordered are listed, but only abnormal results are displayed)  Labs Reviewed  BASIC METABOLIC PANEL - Abnormal; Notable for the following components:      Result Value   Sodium 134 (*)    Creatinine, Ser 1.22 (*)    GFR calc non Af Amer 36 (*)    GFR calc Af Amer 42 (*)    All other components within normal limits  URINALYSIS, COMPLETE (UACMP) WITH MICROSCOPIC - Abnormal; Notable for the following components:   Color, Urine COLORLESS (*)    APPearance CLEAR (*)    Specific Gravity, Urine 1.004 (*)    Leukocytes, UA TRACE (*)    Bacteria, UA RARE (*)    All other components within normal limits  CBC WITH DIFFERENTIAL/PLATELET  TROPONIN I   ____________________________________________  EKG  ED ECG REPORT I, Dionne Bucy, the attending physician, personally viewed and interpreted this ECG.  Date: 02/22/2018 EKG Time: 0658 Rate:  62 Rhythm: normal sinus rhythm QRS Axis: normal Intervals: LBBB ST/T Wave abnormalities: normal Narrative Interpretation: no evidence of acute ischemia; no significant change when compared to EKG of 05/17/2017  ____________________________________________  RADIOLOGY  CXR: No focal infiltrate or other acute findings  ____________________________________________   PROCEDURES  Procedure(s) performed: No  Procedures  Critical Care performed: No ____________________________________________   INITIAL IMPRESSION / ASSESSMENT AND PLAN / ED COURSE  Pertinent labs & imaging results that were available during my care of the patient were reviewed by me and considered in my medical decision making (see chart for details).  84 year old female with PMH as noted above presents with elevated blood pressure over the last day, as well as feeling shaky this morning and generally bad.  I reviewed the past medical records in Epic; the patient was seen by her neurologist  yesterday and initially started on losartan although her primary care doctor Dr. Hyacinth MeekerMiller switched to amlodipine.  The patient states she took a dose last night and this morning.  On exam, the patient is very well-appearing for her age, and the vital signs are normal except for hypertension.  The blood pressure had improved by the time of my exam and was 169/74.  Exam is otherwise unremarkable.  Overall the presentation is consistent with hypertension with no evidence of hypertensive emergency or endorgan dysfunction.  Given the patient's nonspecific symptoms we will obtain basic labs, troponin x1, and chest x-ray.  I anticipate that if these show no significant findings and the patient's blood pressure is improved we will discharge home.  ----------------------------------------- 10:44 AM on 02/22/2018 -----------------------------------------  Blood pressure remains in the 170s over 70s approximately.  The lab work-up and UA are  unremarkable.  At this time there is no evidence of endorgan dysfunction.  The patient feels better and would like to go home.  I instructed her to continue the amlodipine and to follow-up with Dr. Hyacinth MeekerMiller next week.  I discussed the discharge instructions with her and with her family member.  Return precautions given, and they expressed understanding.   ____________________________________________   FINAL CLINICAL IMPRESSION(S) / ED DIAGNOSES  Final diagnoses:  Hypertension, unspecified type      NEW MEDICATIONS STARTED DURING THIS VISIT:  New Prescriptions   No medications on file     Note:  This document was prepared using Dragon voice recognition software and may include unintentional dictation errors.    Dionne BucySiadecki, Theodoro Koval, MD 02/22/18 1045

## 2018-02-22 NOTE — Discharge Instructions (Addendum)
Continue to take the amlodipine as prescribed by Dr. Hyacinth MeekerMiller.  Follow-up with him next week instructed by him to recheck the blood pressure.  Return to the ER for new, worsening, or persistent weakness, feeling shaky, chest pain, difficulty breathing, very high blood pressure (especially greater than 180 over 100) or any other new or worsening symptoms that concern you.

## 2018-02-22 NOTE — ED Notes (Signed)
Pt discharged home after verbalizing understanding of discharge instructions; nad noted. 

## 2018-02-22 NOTE — ED Notes (Signed)
Pt ambulated to restroom with no difficulty.

## 2018-02-22 NOTE — ED Triage Notes (Signed)
Per EMS[facility]. Pt states she was seen yest by PCP and told to come to ED if BP did not get better. Pt states they started her on a new medication yest however pt does not remember the name. Pt denies CP however reports "feels shaky." Pt alert and able to answer questions at this time.

## 2018-06-25 DIAGNOSIS — I872 Venous insufficiency (chronic) (peripheral): Secondary | ICD-10-CM | POA: Insufficient documentation

## 2018-08-21 ENCOUNTER — Other Ambulatory Visit
Admission: RE | Admit: 2018-08-21 | Discharge: 2018-08-21 | Disposition: A | Discharge status: Home / Self Care | Payer: Medicare Other | Attending: Otolaryngology | Admitting: Otolaryngology

## 2018-08-21 DIAGNOSIS — E890 Postprocedural hypothyroidism: Secondary | ICD-10-CM | POA: Diagnosis present

## 2018-08-21 LAB — TSH: TSH: 0.67 u[IU]/mL (ref 0.350–4.500)

## 2018-08-21 LAB — T4, FREE: Free T4: 1.14 ng/dL (ref 0.82–1.77)

## 2018-08-22 LAB — T3, FREE: T3, Free: 2.3 pg/mL (ref 2.0–4.4)

## 2018-11-25 ENCOUNTER — Emergency Department
Admission: EM | Admit: 2018-11-25 | Discharge: 2018-11-25 | Disposition: A | Discharge status: Home / Self Care | Payer: Medicare Other | Attending: Emergency Medicine | Admitting: Emergency Medicine

## 2018-11-25 DIAGNOSIS — Z7902 Long term (current) use of antithrombotics/antiplatelets: Secondary | ICD-10-CM | POA: Insufficient documentation

## 2018-11-25 DIAGNOSIS — Z79899 Other long term (current) drug therapy: Secondary | ICD-10-CM | POA: Insufficient documentation

## 2018-11-25 DIAGNOSIS — Z7982 Long term (current) use of aspirin: Secondary | ICD-10-CM | POA: Diagnosis not present

## 2018-11-25 DIAGNOSIS — J029 Acute pharyngitis, unspecified: Secondary | ICD-10-CM | POA: Diagnosis present

## 2018-11-25 DIAGNOSIS — Z951 Presence of aortocoronary bypass graft: Secondary | ICD-10-CM | POA: Insufficient documentation

## 2018-11-25 DIAGNOSIS — I2510 Atherosclerotic heart disease of native coronary artery without angina pectoris: Secondary | ICD-10-CM | POA: Diagnosis not present

## 2018-11-25 DIAGNOSIS — Z8673 Personal history of transient ischemic attack (TIA), and cerebral infarction without residual deficits: Secondary | ICD-10-CM | POA: Diagnosis not present

## 2018-11-25 DIAGNOSIS — I10 Essential (primary) hypertension: Secondary | ICD-10-CM | POA: Insufficient documentation

## 2018-11-25 LAB — GROUP A STREP BY PCR: Group A Strep by PCR: NOT DETECTED

## 2018-11-25 MED ORDER — ALUM & MAG HYDROXIDE-SIMETH 200-200-20 MG/5ML PO SUSP
30.0000 mL | Freq: Once | ORAL | Status: AC
  Administered 2018-11-25: 05:00:00 30 mL via ORAL
  Filled 2018-11-25: qty 30

## 2018-11-25 MED ORDER — LIDOCAINE VISCOUS HCL 2 % MT SOLN
15.0000 mL | Freq: Once | OROMUCOSAL | Status: AC
  Administered 2018-11-25: 15 mL via ORAL
  Filled 2018-11-25: qty 15

## 2018-11-25 NOTE — ED Provider Notes (Signed)
[facility] Emergency Department Provider Note  Time seen: 4:15 AM  I have reviewed the triage vital signs and the nursing notes.   HISTORY  Chief Complaint Sore Throat    HPI Gabriella Hawkins is a 85 y.o. female with a past medical history of CAD, CVA, hypertension, comes from a nursing facility with complaint of a sore throat.  According to the patient and report for the past 3 days patient has had a sore throat, was prescribed an antibiotic at her nursing facility by the nurse practitioner, but continued to have a sore throat today so they sent her to the emergency department for evaluation.  Here patient states she feels a "soreness" in her mouth and throat.  Denies any known fever.  Denies any cough.  No travel.   Past Medical History:  Diagnosis Date  . CAD (coronary artery disease)   . Carotid atherosclerosis   . CVA (cerebral vascular accident) (HCC)   . Hypertension     Patient Active Problem List   Diagnosis Date Noted  . Chest pain 05/17/2017    Past Surgical History:  Procedure Laterality Date  . APPENDECTOMY    . CORONARY ARTERY BYPASS GRAFT    . RETINAL DETACHMENT SURGERY    . THYROID SURGERY      Prior to Admission medications   Medication Sig Start Date End Date Taking? Authorizing Provider  acetaminophen (TYLENOL) 325 MG tablet Take 650 mg by mouth every 4 (four) hours as needed.    [provider]  aspirin EC 81 MG tablet Take 81 mg by mouth daily.    [provider]  atorvastatin (LIPITOR) 20 MG tablet Take 1 tablet (20 mg total) by mouth daily at 6 PM. 05/18/17   Katha Hamming, MD  Cholecalciferol (VITAMIN D-1000 MAX ST) 1000 units tablet Take 1,000 Units by mouth daily. 10/29/14   [provider]  clopidogrel (PLAVIX) 75 MG tablet Take 1 tablet (75 mg total) by mouth daily. 05/19/17   Katha Hamming, MD  gabapentin (NEURONTIN) 100 MG capsule Take 100 mg by mouth at bedtime. 04/18/17    [provider]  hydrochlorothiazide (HYDRODIURIL) 25 MG tablet Take 25 mg by mouth daily. 04/13/17   [provider]  isosorbide mononitrate (IMDUR) 30 MG 24 hr tablet Take 1 tablet (30 mg total) by mouth daily. 05/19/17   Katha Hamming, MD  levothyroxine (SYNTHROID, LEVOTHROID) 75 MCG tablet Take 75 mcg by mouth every morning. 07/13/15   [provider]  metoprolol tartrate (LOPRESSOR) 25 MG tablet Take 0.5 tablets (12.5 mg total) by mouth 2 (two) times daily. 05/18/17   Katha Hamming, MD  vitamin B-12 (CYANOCOBALAMIN) 1000 MCG tablet Take 1,000 mcg by mouth daily.    [provider]    Allergies  Allergen Reactions  . Phenobarbital     Doesn't remember  . Tramadol     Other reaction(s): Unknown  . Lisinopril Other (See Comments)    dizzy    Family History  Problem Relation Age of Onset  . Hypertension Neg Hx   . Diabetes Mellitus II Neg Hx     Social History Social History   Tobacco Use  . Smoking status: Never Smoker  . Smokeless tobacco: Never Used  Substance Use Topics  . Alcohol use: No  . Drug use: No    Review of Systems Constitutional: Negative for fever. ENT: States a sore throat, and bitter taste in her mouth. Cardiovascular: Negative for chest pain. Respiratory: Negative  for shortness of breath.  Negative for cough. Gastrointestinal: Negative for abdominal pain, vomiting  Musculoskeletal: Negative for musculoskeletal complaints Neurological: Negative for headache All other ROS negative  ____________________________________________   PHYSICAL EXAM:  VITAL SIGNS: ED Triage Vitals  Enc Vitals Group     BP 11/25/18 0353 (!) 154/84     Pulse Rate 11/25/18 0353 76     Resp 11/25/18 0353 18     Temp 11/25/18 0353 98.6 F (37 C)     Temp Source 11/25/18 0353 Oral     SpO2 11/25/18 0353 96 %     Weight 11/25/18 0354 147 lb (66.7 kg)     Height 11/25/18 0354 5\' 6"  (1.676 m)     Head Circumference --       Peak Flow --      Pain Score --      Pain Loc --      Pain Edu? --      Excl. in GC? --    Constitutional: Alert. Well appearing and in no distress. Eyes: Normal exam ENT      Head: Normocephalic and atraumatic.      Mouth/Throat: Mucous membranes are moist.  No pharyngeal erythema, exudate or lesions noted. Cardiovascular: Normal rate, regular rhythm.  Respiratory: Normal respiratory effort without tachypnea nor retractions. Breath sounds are clear  Gastrointestinal: Soft and nontender. No distention.   Musculoskeletal: Nontender with normal range of motion in all extremities.  Neurologic:  Normal speech and language. No gross focal neurologic deficits  Skin:  Skin is warm, dry and intact.  Psychiatric: Mood and affect are normal.   ____________________________________________   INITIAL IMPRESSION / ASSESSMENT AND PLAN / ED COURSE  Pertinent labs & imaging results that were available during my care of the patient were reviewed by me and considered in my medical decision making (see chart for details).   Patient presents to the emergency department from a nursing facility for a sore throat.  Per report patient was started on antibiotics by the nurse practitioner at the nursing facility.  It is not entirely clear why they sent the patient to the emergency department for o'clock in the morning via EMS for her sore throat evaluation.  Here the patient is calm cooperative and pleasant, no distress, states she has had a sore throat for 3 days.  On examination the patient's oropharynx appears normal, no obvious erythema or exudate or lesions.  No signs of thrush.  We will obtain a strep swab as a precaution.  We will dose a GI cocktail in the emergency department and reassess.  Strep test is negative.  Patient will be discharged back to her nursing facility with PCP follow-up.   Gabriella Hawkins was evaluated in Emergency Department on 11/25/2018 for the symptoms described in the  history of present illness. She was evaluated in the context of the global COVID-19 pandemic, which necessitated consideration that the patient might be at risk for infection with the SARS-CoV-2 virus that causes COVID-19. Institutional protocols and algorithms that pertain to the evaluation of patients at risk for COVID-19 are in a state of rapid change based on information released by regulatory bodies including the CDC and federal and state organizations. These policies and algorithms were followed during the patient's care in the ED.  ____________________________________________   FINAL CLINICAL IMPRESSION(S) / ED DIAGNOSES  Sore throat   Minna AntisPaduchowski, Betsi Crespi, MD 11/25/18 0502

## 2018-11-25 NOTE — ED Triage Notes (Signed)
Pt here via ems ,throat complaint .

## 2018-12-07 ENCOUNTER — Observation Stay
Admission: EM | Admit: 2018-12-07 | Discharge: 2018-12-10 | Disposition: A | Discharge status: Other Acute Inpatient Hospital | Payer: Medicare Other | Attending: Internal Medicine | Admitting: Internal Medicine

## 2018-12-07 ENCOUNTER — Emergency Department: Payer: Medicare Other

## 2018-12-07 ENCOUNTER — Other Ambulatory Visit: Payer: Self-pay

## 2018-12-07 DIAGNOSIS — Z951 Presence of aortocoronary bypass graft: Secondary | ICD-10-CM | POA: Insufficient documentation

## 2018-12-07 DIAGNOSIS — R4182 Altered mental status, unspecified: Secondary | ICD-10-CM | POA: Diagnosis not present

## 2018-12-07 DIAGNOSIS — E871 Hypo-osmolality and hyponatremia: Secondary | ICD-10-CM | POA: Insufficient documentation

## 2018-12-07 DIAGNOSIS — Z7982 Long term (current) use of aspirin: Secondary | ICD-10-CM | POA: Insufficient documentation

## 2018-12-07 DIAGNOSIS — K449 Diaphragmatic hernia without obstruction or gangrene: Secondary | ICD-10-CM | POA: Diagnosis not present

## 2018-12-07 DIAGNOSIS — G9341 Metabolic encephalopathy: Secondary | ICD-10-CM | POA: Diagnosis not present

## 2018-12-07 DIAGNOSIS — E860 Dehydration: Secondary | ICD-10-CM | POA: Diagnosis present

## 2018-12-07 DIAGNOSIS — R627 Adult failure to thrive: Secondary | ICD-10-CM | POA: Insufficient documentation

## 2018-12-07 DIAGNOSIS — N179 Acute kidney failure, unspecified: Secondary | ICD-10-CM | POA: Diagnosis present

## 2018-12-07 DIAGNOSIS — Z7902 Long term (current) use of antithrombotics/antiplatelets: Secondary | ICD-10-CM | POA: Insufficient documentation

## 2018-12-07 DIAGNOSIS — Z8673 Personal history of transient ischemic attack (TIA), and cerebral infarction without residual deficits: Secondary | ICD-10-CM | POA: Insufficient documentation

## 2018-12-07 DIAGNOSIS — E876 Hypokalemia: Secondary | ICD-10-CM | POA: Diagnosis not present

## 2018-12-07 DIAGNOSIS — I129 Hypertensive chronic kidney disease with stage 1 through stage 4 chronic kidney disease, or unspecified chronic kidney disease: Secondary | ICD-10-CM | POA: Insufficient documentation

## 2018-12-07 DIAGNOSIS — Z66 Do not resuscitate: Secondary | ICD-10-CM | POA: Diagnosis not present

## 2018-12-07 DIAGNOSIS — I2510 Atherosclerotic heart disease of native coronary artery without angina pectoris: Secondary | ICD-10-CM | POA: Insufficient documentation

## 2018-12-07 DIAGNOSIS — Z79890 Hormone replacement therapy (postmenopausal): Secondary | ICD-10-CM | POA: Insufficient documentation

## 2018-12-07 DIAGNOSIS — Z515 Encounter for palliative care: Secondary | ICD-10-CM

## 2018-12-07 DIAGNOSIS — I491 Atrial premature depolarization: Secondary | ICD-10-CM | POA: Diagnosis not present

## 2018-12-07 DIAGNOSIS — Z7189 Other specified counseling: Secondary | ICD-10-CM

## 2018-12-07 DIAGNOSIS — I447 Left bundle-branch block, unspecified: Secondary | ICD-10-CM | POA: Diagnosis not present

## 2018-12-07 DIAGNOSIS — N189 Chronic kidney disease, unspecified: Secondary | ICD-10-CM | POA: Insufficient documentation

## 2018-12-07 DIAGNOSIS — Z79899 Other long term (current) drug therapy: Secondary | ICD-10-CM | POA: Insufficient documentation

## 2018-12-07 LAB — COMPREHENSIVE METABOLIC PANEL
ALT: 38 U/L (ref 0–44)
AST: 44 U/L — ABNORMAL HIGH (ref 15–41)
Albumin: 4 g/dL (ref 3.5–5.0)
Alkaline Phosphatase: 76 U/L (ref 38–126)
Anion gap: 16 — ABNORMAL HIGH (ref 5–15)
BUN: 57 mg/dL — ABNORMAL HIGH (ref 8–23)
CO2: 22 mmol/L (ref 22–32)
Calcium: 9.1 mg/dL (ref 8.9–10.3)
Chloride: 91 mmol/L — ABNORMAL LOW (ref 98–111)
Creatinine, Ser: 2.58 mg/dL — ABNORMAL HIGH (ref 0.44–1.00)
GFR calc Af Amer: 18 mL/min — ABNORMAL LOW (ref >60)
GFR calc non Af Amer: 15 mL/min — ABNORMAL LOW (ref >60)
Glucose, Bld: 183 mg/dL — ABNORMAL HIGH (ref 70–99)
Potassium: 3.2 mmol/L — ABNORMAL LOW (ref 3.5–5.1)
Sodium: 129 mmol/L — ABNORMAL LOW (ref 135–145)
Total Bilirubin: 1 mg/dL (ref 0.3–1.2)
Total Protein: 7.4 g/dL (ref 6.5–8.1)

## 2018-12-07 LAB — CBC
HCT: 39 % (ref 36.0–46.0)
Hemoglobin: 13.3 g/dL (ref 12.0–15.0)
MCH: 29.1 pg (ref 26.0–34.0)
MCHC: 34.1 g/dL (ref 30.0–36.0)
MCV: 85.3 fL (ref 80.0–100.0)
Platelets: 234 10*3/uL (ref 150–400)
RBC: 4.57 MIL/uL (ref 3.87–5.11)
RDW: 13.9 % (ref 11.5–15.5)
WBC: 11.9 10*3/uL — ABNORMAL HIGH (ref 4.0–10.5)
nRBC: 0 % (ref 0.0–0.2)

## 2018-12-07 LAB — TROPONIN I: Troponin I: <0.03 ng/mL (ref <0.03)

## 2018-12-07 MED ORDER — SODIUM CHLORIDE 0.9 % IV BOLUS
1000.0000 mL | Freq: Once | INTRAVENOUS | Status: AC
  Administered 2018-12-07: 1000 mL via INTRAVENOUS

## 2018-12-07 MED ORDER — SODIUM CHLORIDE 0.9% FLUSH: 3.0000 mL | Freq: Once | INTRAVENOUS | Status: DC

## 2018-12-07 NOTE — ED Provider Notes (Signed)
[facility] Emergency Department Provider Note   ____________________________________________   First MD Initiated Contact with Patient 12/07/18 2313     (approximate)  I have reviewed the triage vital signs and the nursing notes.   HISTORY  Chief Complaint Altered Mental Status    HPI Gabriella Hawkins is a 85 y.o. female brought to the ED via EMS [facility] with a chief complaint of altered mentation.  Staff reported that patient was confused, wandering around, stating she was [place] the country".  Patient fell 3 times on Tuesday.  States she does not know why she fell.  Currently voicing no complaints.  Denies recent fever, cough, chest pain, shortness of breath, abdominal pain, nausea or vomiting.  Denies recent travel or exposure to persons diagnosed with coronavirus.      Past Medical History:  Diagnosis Date   CAD (coronary artery disease)    Carotid atherosclerosis    CVA (cerebral vascular accident) [facility](HCC)    Hypertension     Patient Active Problem List   Diagnosis Date Noted   Acute kidney failure (HCC) 12/08/2018   Chest pain 05/17/2017    Past Surgical History:  Procedure Laterality Date   APPENDECTOMY     CORONARY ARTERY BYPASS GRAFT     RETINAL DETACHMENT SURGERY     THYROID SURGERY      Prior to Admission medications   Medication Sig Start Date End Date Taking? Authorizing Provider  acetaminophen (TYLENOL) 325 MG tablet Take 650 mg by mouth every 4 (four) hours as needed.   Yes [provider]  aspirin EC 81 MG tablet Take 81 mg by mouth daily.   Yes [provider]  atorvastatin (LIPITOR) 20 MG tablet Take 1 tablet (20 mg total) by mouth daily at 6 PM. 05/18/17  Yes Katha HammingKonidena, Snehalatha, MD  Cholecalciferol (VITAMIN D-1000 MAX ST) 1000 units tablet Take 1,000 Units by mouth daily. 10/29/14  Yes [provider]  clopidogrel  (PLAVIX) 75 MG tablet Take 1 tablet (75 mg total) by mouth daily. 05/19/17  Yes Katha HammingKonidena, Snehalatha, MD  gabapentin (NEURONTIN) 100 MG capsule Take 100 mg by mouth at bedtime. 04/18/17  Yes [provider]  hydrochlorothiazide (MICROZIDE) 12.5 MG capsule Take 12.5 mg by mouth daily.  04/13/17  Yes [provider]  levothyroxine (SYNTHROID, LEVOTHROID) 75 MCG tablet Take 75 mcg by mouth every morning. 07/13/15  Yes [provider]  metoprolol tartrate (LOPRESSOR) 25 MG tablet Take 0.5 tablets (12.5 mg total) by mouth 2 (two) times daily. 05/18/17  Yes Katha HammingKonidena, Snehalatha, MD  isosorbide mononitrate (IMDUR) 30 MG 24 hr tablet Take 1 tablet (30 mg total) by mouth daily. Patient not taking: Reported on 12/08/2018 05/19/17   Katha HammingKonidena, Snehalatha, MD  vitamin B-12 (CYANOCOBALAMIN) 1000 MCG tablet Take 1,000 mcg by mouth daily.    [provider]    Allergies Phenobarbital; Tramadol; and Lisinopril  Family History  Problem Relation Age of Onset   Hypertension Neg Hx    Diabetes Mellitus II Neg Hx     Social History Social History   Tobacco Use   Smoking status: Never Smoker   Smokeless tobacco: Never Used  Substance Use Topics   Alcohol use: No   Drug use: No    Review of Systems  Constitutional: No fever/chills Eyes: No visual changes. ENT: No sore throat. Cardiovascular: Denies chest pain. Respiratory: Denies shortness of breath. Gastrointestinal: No abdominal pain.  No nausea, no vomiting.  No diarrhea.  No constipation. Genitourinary: Negative for dysuria. Musculoskeletal: Negative for back pain. Skin: Negative for rash. Neurological: Positive for confusion.  Negative for headaches, focal weakness or numbness.   ____________________________________________   PHYSICAL EXAM:  VITAL SIGNS: ED Triage Vitals  Enc Vitals Group     BP --      Pulse --      Resp --      Temp 12/07/18 2244 97.7 F (36.5 C)     Temp Source 12/07/18 2244  Oral     SpO2 --      Weight 12/07/18 2245 147 lb (66.7 kg)     Height 12/07/18 2245  (1.676 m)     Head Circumference --      Peak Flow --      Pain Score 12/07/18 2244 5     Pain Loc --      Pain Edu? --      Excl. in GC? --     Constitutional: Alert and oriented. Well appearing and in no acute distress. Eyes: Conjunctivae are normal. PERRL. EOMI. Head: Atraumatic. Nose: No congestion/rhinnorhea. Mouth/Throat: Mucous membranes are moist.  Oropharynx non-erythematous. Neck: No stridor.   Cardiovascular: Normal rate, regular rhythm. Grossly normal heart sounds.  Good peripheral circulation. Respiratory: Normal respiratory effort.  No retractions. Lungs CTAB. Gastrointestinal: Soft and nontender. No distention. No abdominal bruits. No CVA tenderness. Musculoskeletal: No lower extremity tenderness nor edema.  No joint effusions. Neurologic: Alert and oriented x3.  CN II-XII grossly intact.  Normal speech and language. No gross focal neurologic deficits are appreciated.  Skin:  Skin is warm, dry and intact. No rash noted. Psychiatric: Mood and affect are normal. Speech and behavior are normal.  ____________________________________________   LABS (all labs ordered are listed, but only abnormal results are displayed)  Labs Reviewed  COMPREHENSIVE METABOLIC PANEL - Abnormal; Notable for the following components:      Result Value   Sodium 129 (*)    Potassium 3.2 (*)    Chloride 91 (*)    Glucose, Bld 183 (*)    BUN 57 (*)    Creatinine, Ser 2.58 (*)    AST 44 (*)    GFR calc non Af Amer 15 (*)    GFR calc Af Amer 18 (*)    Anion gap 16 (*)    All other components within normal limits  CBC - Abnormal; Notable for the following components:   WBC 11.9 (*)    All other components within normal limits  URINALYSIS, COMPLETE (UACMP) WITH MICROSCOPIC - Abnormal; Notable for the following components:   Color, Urine YELLOW (*)    APPearance HAZY (*)    All other components  within normal limits  BASIC METABOLIC PANEL - Abnormal; Notable for the following components:   Sodium 131 (*)    Potassium 3.3 (*)    Chloride 97 (*)    Glucose, Bld 152 (*)    BUN 54 (*)    Creatinine, Ser 2.25 (*)    Calcium 8.5 (*)    GFR calc non Af Amer 18 (*)    GFR calc Af Amer 21 (*)    All other components within normal limits  URINE CULTURE  TROPONIN I  LACTIC ACID, PLASMA  CBG MONITORING, ED   ____________________________________________  EKG  ED ECG REPORT I, Marcena Dias J, the attending physician, personally viewed and interpreted this ECG.   Date: 12/07/2018  EKG Time: 2253  Rate: 85  Rhythm: normal EKG, normal sinus rhythm  Axis: LAD  Intervals:left bundle branch block  ST&T Change: Nonspecific  ____________________________________________  RADIOLOGY  ED MD interpretation: No ICH, no pneumonia  Official radiology report(s): Ct Head Wo Contrast  Result Date: 12/07/2018 CLINICAL DATA:  Altered mental status EXAM: CT HEAD WITHOUT CONTRAST TECHNIQUE: Contiguous axial images were obtained from the base of the skull through the vertex without intravenous contrast. COMPARISON:  None. FINDINGS: Brain: There is no mass, hemorrhage or extra-axial collection. There is generalized atrophy without lobar predilection. Hypodensity of the white matter is most commonly associated with chronic microvascular disease. Vascular: Atherosclerotic calcification of the vertebral and internal carotid arteries at the skull base. No abnormal hyperdensity of the major intracranial arteries or dural venous sinuses. Skull: The visualized skull base, calvarium and extracranial soft tissues are normal. Sinuses/Orbits: No fluid levels or advanced mucosal thickening of the visualized paranasal sinuses. No mastoid or middle ear effusion. The orbits are normal. IMPRESSION: Atrophy and chronic small vessel ischemia without acute abnormality. Electronically Signed   By: Deatra Robinson M.D.   On:  12/07/2018 23:43   Dg Chest Port 1 View  Result Date: 12/07/2018 CLINICAL DATA:  Confusion EXAM: PORTABLE CHEST 1 VIEW COMPARISON:  02/22/2018 FINDINGS: Prior CABG. Cardiomegaly. Large hiatal hernia. No confluent airspace opacities or effusions. No acute bony abnormality. IMPRESSION: Cardiomegaly.  Large hiatal hernia.  No active disease. Electronically Signed   By: Charlett Nose M.D.   On: 12/07/2018 23:42    ____________________________________________   PROCEDURES  Procedure(s) performed (including Critical Care):  Procedures   ____________________________________________   INITIAL IMPRESSION / ASSESSMENT AND PLAN / ED COURSE  As part of my medical decision making, I reviewed the following data within the electronic MEDICAL RECORD NUMBER Nursing notes reviewed and incorporated, Labs reviewed, EKG interpreted, Old chart reviewed, Radiograph reviewed, Discussed with admitting physician and Notes from prior ED visits     Gabriella Hawkins was evaluated in Emergency Department on 12/08/2018 for the symptoms described in the history of present illness. She was evaluated in the context of the global COVID-19 pandemic, which necessitated consideration that the patient might be at risk for infection with the SARS-CoV-2 virus that causes COVID-19. Institutional protocols and algorithms that pertain to the evaluation of patients at risk for COVID-19 are in a state of rapid change based on information released by regulatory bodies including the CDC and federal and state organizations. These policies and algorithms were followed during the patient's care in the ED.   85 year old female sent to the ED for altered mentation.  Differential diagnosis includes but is not limited to CVA, ICH, ACS, infectious, metabolic etiologies, etc. Will obtain lab work, urinalysis, x-ray, CT head and reassess.   Clinical Course as of Dec 08 411  Fri Dec 07, 2018  2347 BUN 28/Cr 1.2 on 10/04/2018   [JS]  Sat  Dec 08, 2018  0037 Spoke with hospitalist Dr. Arville Care who recommends rechecking BMP after 1 L IV fluids.  We will also ambulate patient to assess her gait.  He feels if patient is not altered and ambulates safety then she would be able to be discharged back to her facility.   [JS]  0300 Discussed with again with hospitalist Dr. Arville Care results of patient's repeat BMP and lactic acid.  Patient was fairly unsteady on her feet when she was ambulated.  Hospitalist to evaluate patient in the emergency department for admission.   [JS]    Clinical Course  User Index [JS] Irean Hong, MD     ____________________________________________   FINAL CLINICAL IMPRESSION(S) / ED DIAGNOSES  Final diagnoses:  Altered mental status, unspecified altered mental status type  Dehydration  AKI (acute kidney injury) (HCC)  Hyponatremia     ED Discharge Orders    None       Note:  This document was prepared using Dragon voice recognition software and may include unintentional dictation errors.   Irean Hong, MD 12/08/18 [phone]

## 2018-12-07 NOTE — ED Triage Notes (Signed)
Pt arrived from [facility] from [facility] where she has been altered for the past 30 mins. She was wandering around stating that she was [place] the country. Pt has fallen 3 times recently and was not checked out.

## 2018-12-07 NOTE — ED Notes (Signed)
Gabriella Hawkins- [phone]

## 2018-12-08 DIAGNOSIS — N179 Acute kidney failure, unspecified: Secondary | ICD-10-CM | POA: Diagnosis present

## 2018-12-08 LAB — URINALYSIS, COMPLETE (UACMP) WITH MICROSCOPIC
Bacteria, UA: NONE SEEN
Bilirubin Urine: NEGATIVE
Glucose, UA: NEGATIVE mg/dL
Hgb urine dipstick: NEGATIVE
Ketones, ur: NEGATIVE mg/dL
Leukocytes,Ua: NEGATIVE
Nitrite: NEGATIVE
Protein, ur: NEGATIVE mg/dL
Specific Gravity, Urine: 1.019 (ref 1.005–1.030)
Squamous Epithelial / HPF: NONE SEEN (ref 0–5)
pH: 5 (ref 5.0–8.0)

## 2018-12-08 LAB — BASIC METABOLIC PANEL
Anion gap: 12 (ref 5–15)
BUN: 54 mg/dL — ABNORMAL HIGH (ref 8–23)
CO2: 22 mmol/L (ref 22–32)
Calcium: 8.5 mg/dL — ABNORMAL LOW (ref 8.9–10.3)
Chloride: 97 mmol/L — ABNORMAL LOW (ref 98–111)
Creatinine, Ser: 2.25 mg/dL — ABNORMAL HIGH (ref 0.44–1.00)
GFR calc Af Amer: 21 mL/min — ABNORMAL LOW (ref >60)
GFR calc non Af Amer: 18 mL/min — ABNORMAL LOW (ref >60)
Glucose, Bld: 152 mg/dL — ABNORMAL HIGH (ref 70–99)
Potassium: 3.3 mmol/L — ABNORMAL LOW (ref 3.5–5.1)
Sodium: 131 mmol/L — ABNORMAL LOW (ref 135–145)

## 2018-12-08 LAB — LACTIC ACID, PLASMA: Lactic Acid, Venous: 0.9 mmol/L (ref 0.5–1.9)

## 2018-12-08 MED ORDER — ASPIRIN EC 81 MG PO TBEC
81.0000 mg | DELAYED_RELEASE_TABLET | Freq: Every day | ORAL | Status: DC
  Administered 2018-12-08 – 2018-12-10 (×3): 81 mg via ORAL
  Filled 2018-12-08 (×3): qty 1

## 2018-12-08 MED ORDER — ALUM & MAG HYDROXIDE-SIMETH 200-200-20 MG/5ML PO SUSP: 30.0000 mL | Freq: Four times a day (QID) | ORAL | Status: DC | PRN

## 2018-12-08 MED ORDER — HALOPERIDOL 0.5 MG PO TABS
0.5000 mg | ORAL_TABLET | ORAL | Status: DC | PRN
  Filled 2018-12-08: qty 1

## 2018-12-08 MED ORDER — SODIUM CHLORIDE 0.9 % IV SOLN: INTRAVENOUS | Status: DC

## 2018-12-08 MED ORDER — MAGIC MOUTHWASH
15.0000 mL | Freq: Four times a day (QID) | ORAL | Status: DC | PRN
  Filled 2018-12-08: qty 20

## 2018-12-08 MED ORDER — BISACODYL 10 MG RE SUPP: 10.0000 mg | Freq: Every day | RECTAL | Status: DC | PRN

## 2018-12-08 MED ORDER — TRAZODONE HCL 50 MG PO TABS: 25.0000 mg | ORAL_TABLET | Freq: Every evening | ORAL | Status: DC | PRN

## 2018-12-08 MED ORDER — ENOXAPARIN SODIUM 40 MG/0.4ML ~~LOC~~ SOLN: 40.0000 mg | SUBCUTANEOUS | Status: DC

## 2018-12-08 MED ORDER — ONDANSETRON HCL 4 MG/2ML IJ SOLN: 4.0000 mg | Freq: Four times a day (QID) | INTRAMUSCULAR | Status: DC | PRN

## 2018-12-08 MED ORDER — ACETAMINOPHEN 650 MG RE SUPP: 650.0000 mg | Freq: Four times a day (QID) | RECTAL | Status: DC | PRN

## 2018-12-08 MED ORDER — POLYVINYL ALCOHOL 1.4 % OP SOLN
1.0000 [drp] | Freq: Four times a day (QID) | OPHTHALMIC | Status: DC | PRN
  Filled 2018-12-08: qty 15

## 2018-12-08 MED ORDER — DIPHENHYDRAMINE HCL 50 MG/ML IJ SOLN: 12.5000 mg | INTRAMUSCULAR | Status: DC | PRN

## 2018-12-08 MED ORDER — MORPHINE SULFATE (PF) 2 MG/ML IV SOLN
1.0000 mg | INTRAVENOUS | Status: DC | PRN
  Administered 2018-12-08 – 2018-12-09 (×3): 1 mg via INTRAVENOUS
  Filled 2018-12-08 (×3): qty 1

## 2018-12-08 MED ORDER — MORPHINE SULFATE (CONCENTRATE) 10 MG/0.5ML PO SOLN: 5.0000 mg | ORAL | Status: DC | PRN

## 2018-12-08 MED ORDER — HEPARIN SODIUM (PORCINE) 5000 UNIT/ML IJ SOLN: 5000.0000 [IU] | Freq: Three times a day (TID) | INTRAMUSCULAR | Status: DC

## 2018-12-08 MED ORDER — BACLOFEN 10 MG PO TABS
5.0000 mg | ORAL_TABLET | Freq: Two times a day (BID) | ORAL | Status: DC | PRN
  Filled 2018-12-08: qty 0.5

## 2018-12-08 MED ORDER — HALOPERIDOL LACTATE 2 MG/ML PO CONC
0.5000 mg | ORAL | Status: DC | PRN
  Filled 2018-12-08: qty 0.3

## 2018-12-08 MED ORDER — ONDANSETRON HCL 4 MG PO TABS: 4.0000 mg | ORAL_TABLET | Freq: Four times a day (QID) | ORAL | Status: DC | PRN

## 2018-12-08 MED ORDER — HALOPERIDOL LACTATE 5 MG/ML IJ SOLN: 0.5000 mg | INTRAMUSCULAR | Status: DC | PRN

## 2018-12-08 MED ORDER — ACETAMINOPHEN 325 MG PO TABS
650.0000 mg | ORAL_TABLET | Freq: Four times a day (QID) | ORAL | Status: DC | PRN
  Administered 2018-12-10: 650 mg via ORAL
  Filled 2018-12-08: qty 2

## 2018-12-08 MED ORDER — ATROPINE SULFATE 1 % OP SOLN
4.0000 [drp] | OPHTHALMIC | Status: DC | PRN
  Filled 2018-12-08: qty 2

## 2018-12-08 MED ORDER — SENNOSIDES-DOCUSATE SODIUM 8.6-50 MG PO TABS: 1.0000 | ORAL_TABLET | Freq: Every evening | ORAL | Status: DC | PRN

## 2018-12-08 MED ORDER — BIOTENE DRY MOUTH MT LIQD: 15.0000 mL | OROMUCOSAL | Status: DC | PRN

## 2018-12-08 MED ORDER — POTASSIUM CHLORIDE IN NACL 20-0.9 MEQ/L-% IV SOLN
INTRAVENOUS | Status: AC
  Administered 2018-12-08 – 2018-12-09 (×2): via INTRAVENOUS
  Filled 2018-12-08 (×2): qty 1000

## 2018-12-08 MED ORDER — LOPERAMIDE HCL 2 MG PO CAPS: 2.0000 mg | ORAL_CAPSULE | ORAL | Status: DC | PRN

## 2018-12-08 NOTE — H&P (Signed)
[employer] [facility] [place] [facility]   PATIENT NAME: Gabriella Hawkins    MR#:  [id number]  DATE OF BIRTH:  10-09-1919  DATE OF ADMISSION:  12/07/2018  PRIMARY CARE PHYSICIAN: Danella Penton, MD   REQUESTING/REFERRING PHYSICIAN: Chiquita Loth MD  CHIEF COMPLAINT:   Chief Complaint  Patient presents with  . Altered Mental Status    HISTORY OF PRESENT ILLNESS:  Gabriella Hawkins  is a 85 y.o. female with a known history of possible dementia, CAD, CVA presents the ED with complaints of altered mental status from her assisted living facility 12/07/18.  Per ED, the assisted-living facility stated the patient was confused, wandering around, stating she was waiting for someone [place] her [place] the country.  Patient cannot give a history due to altered mental status.  Spoke with her nephew and he stated that the patient has been mentally and physically declining over the last month.  He believes the patient has dementia because her twin sister died a few years ago with it.  The patient nephew does not want any additional medical interventions.  Patient's nephew want to place her under hospice and comfort care.  Note: Call the patient's nephew after 1 PM.  PAST MEDICAL HISTORY:   Past Medical History:  Diagnosis Date  . CAD (coronary artery disease)   . Carotid atherosclerosis   . CVA (cerebral vascular accident) (HCC)   . Hypertension     PAST SURGICAL HISTORY:   Past Surgical History:  Procedure Laterality Date  . APPENDECTOMY    . CORONARY ARTERY BYPASS GRAFT    . RETINAL DETACHMENT SURGERY    . THYROID SURGERY      SOCIAL HISTORY:   Social History   Tobacco Use  . Smoking status: Never Smoker  . Smokeless tobacco: Never Used  Substance Use Topics  . Alcohol use: No    FAMILY HISTORY:   Family History  Problem Relation Age of Onset  . Hypertension Neg Hx   . Diabetes Mellitus II Neg Hx     DRUG ALLERGIES:   Allergies  Allergen Reactions  .  Phenobarbital     Doesn't remember  . Tramadol     Other reaction(s): Unknown  . Lisinopril Other (See Comments)    dizzy    REVIEW OF SYSTEMS:   Review of Systems  Unable to perform ROS: Mental status change    MEDICATIONS AT HOME:   Prior to Admission medications   Medication Sig Start Date End Date Taking? Authorizing Provider  acetaminophen (TYLENOL) 325 MG tablet Take 650 mg by mouth every 4 (four) hours as needed.   Yes [provider]  aspirin EC 81 MG tablet Take 81 mg by mouth daily.   Yes [provider]  atorvastatin (LIPITOR) 20 MG tablet Take 1 tablet (20 mg total) by mouth daily at 6 PM. 05/18/17  Yes Katha Hamming, MD  Cholecalciferol (VITAMIN D-1000 MAX ST) 1000 units tablet Take 1,000 Units by mouth daily. 10/29/14  Yes [provider]  clopidogrel (PLAVIX) 75 MG tablet Take 1 tablet (75 mg total) by mouth daily. 05/19/17  Yes Katha Hamming, MD  gabapentin (NEURONTIN) 100 MG capsule Take 100 mg by mouth at bedtime. 04/18/17  Yes [provider]  hydrochlorothiazide (MICROZIDE) 12.5 MG capsule Take 12.5 mg by mouth daily.  04/13/17  Yes [provider]  levothyroxine (SYNTHROID, LEVOTHROID) 75 MCG tablet Take 75 mcg by mouth every morning. 07/13/15  Yes [provider]  metoprolol tartrate (LOPRESSOR) 25 MG tablet Take 0.5 tablets (12.5 mg total) by mouth 2 (two) times daily. 05/18/17  Yes Katha HammingKonidena, Snehalatha, MD  isosorbide mononitrate (IMDUR) 30 MG 24 hr tablet Take 1 tablet (30 mg total) by mouth daily. Patient not taking: Reported on 12/08/2018 05/19/17   Katha HammingKonidena, Snehalatha, MD  vitamin B-12 (CYANOCOBALAMIN) 1000 MCG tablet Take 1,000 mcg by mouth daily.    [provider]      VITAL SIGNS:  Blood pressure 139/72, pulse 84, temperature 97.7 F (36.5 C), temperature source Oral, resp. rate 20, height 5\' 6"  (1.676 m), weight 66.7 kg, SpO2 95 %.  PHYSICAL EXAMINATION:  Physical Exam   GENERAL:  85 y.o.-year-old patient lying in the bed in the fetal position EYES: Pupils equal, round, reactive to light and accommodation. No scleral icterus. Extraocular muscles intact.  HEENT: Head atraumatic, normocephalic. Oropharynx and nasopharynx clear.  NECK:  Supple, no jugular venous distention.  LUNGS: Diminished bilaterally, no wheezing, rales,rhonchi or crepitation. No use of accessory muscles of respiration.  CARDIOVASCULAR: Regular rate and rhythm, murmur. No rubs or gallops.  ABDOMEN: Soft, nontender, nondistended. Bowel sounds present.   EXTREMITIES: No pedal edema, cyanosis, or clubbing.  NEUROLOGIC: Cranial nerves II through XII are intact.  Generalized weakness. sensation intact. Gait not checked.  PSYCHIATRIC: The patient is alert and disoriented.  SKIN: Several bruises. No obvious rash, lesion, or ulcer.   LABORATORY PANEL:   CBC Recent Labs  Lab 12/07/18 2256  WBC 11.9*  HGB 13.3  HCT 39.0  PLT 234   ------------------------------------------------------------------------------------------------------------------  Chemistries  Recent Labs  Lab 12/07/18 2256 12/08/18 0229  NA 129* 131*  K 3.2* 3.3*  CL 91* 97*  CO2 22 22  GLUCOSE 183* 152*  BUN 57* 54*  CREATININE 2.58* 2.25*  CALCIUM 9.1 8.5*  AST 44*  --   ALT 38  --   ALKPHOS 76  --   BILITOT 1.0  --    ------------------------------------------------------------------------------------------------------------------  Cardiac Enzymes Recent Labs  Lab 12/07/18 2256  TROPONINI <0.03   ------------------------------------------------------------------------------------------------------------------  RADIOLOGY:  Ct Head Wo Contrast  Result Date: 12/07/2018 CLINICAL DATA:  Altered mental status EXAM: CT HEAD WITHOUT CONTRAST TECHNIQUE: Contiguous axial images were obtained from the base of the skull through the vertex without intravenous contrast. COMPARISON:  None. FINDINGS: Brain: There is  no mass, hemorrhage or extra-axial collection. There is generalized atrophy without lobar predilection. Hypodensity of the white matter is most commonly associated with chronic microvascular disease. Vascular: Atherosclerotic calcification of the vertebral and internal carotid arteries at the skull base. No abnormal hyperdensity of the major intracranial arteries or dural venous sinuses. Skull: The visualized skull base, calvarium and extracranial soft tissues are normal. Sinuses/Orbits: No fluid levels or advanced mucosal thickening of the visualized paranasal sinuses. No mastoid or middle ear effusion. The orbits are normal. IMPRESSION: Atrophy and chronic small vessel ischemia without acute abnormality. Electronically Signed   By: Deatra RobinsonKevin  Herman M.D.   On: 12/07/2018 23:43   Dg Chest Port 1 View  Result Date: 12/07/2018 CLINICAL DATA:  Confusion EXAM: PORTABLE CHEST 1 VIEW COMPARISON:  02/22/2018 FINDINGS: Prior CABG. Cardiomegaly. Large hiatal hernia. No confluent airspace opacities or effusions. No acute bony abnormality. IMPRESSION: Cardiomegaly.  Large hiatal hernia.  No active disease. Electronically Signed   By: Charlett NoseKevin  Dover M.D.   On: 12/07/2018 23:42      IMPRESSION AND PLAN:   Acute metabolic encephalopathy-possibly due to dementia and or kidney failure.  Aspiration and falls  precautions.  No additional interventions due to patient becoming hospice/comfort care.  Acute on chronic kidney failure- no additional interventions due to patient becoming hospice/comfort care  Acute hyponatremia-possibly due to kidney failure.  No additional interventions due to patient becoming hospice/comfort care  Acute hypokalemia- no additional interventions due to the pt becoming hospice/comfort care  DNR- reviewed with the patient's nephew her diagnoses, treatments, prognosis.  The patient's nephew decided to make the patient hospice/comfort care.  Palliative care consult.  Case management consult.   Please call the patient's nephew after 1 PM.  All the records are reviewed and case discussed with ED provider. Management plans discussed with the patient, family and they are in agreement.  CODE STATUS: DNR  TOTAL TIME TAKING CARE OF THIS PATIENT: 60 minutes.    Raliegh Scarlet FNP-C on 12/08/2018 at 4:19 AM  Between 7am to 6pm - Pager - [phone]  After 6pm go to [email or web address] - Social research officer, government  [facility]  Office  [phone]  CC: Primary care physician; Danella Penton, MD   Note: This dictation was prepared with Dragon dictation along with smaller phrase technology. Any transcriptional errors that result from this process are unintentional.

## 2018-12-08 NOTE — Progress Notes (Signed)
Patient refused and educated on turning every 2 hours. Pt stated "turning me gets my pain started up all over again."

## 2018-12-08 NOTE — Progress Notes (Signed)
[facility] - [place] [facility]   PATIENT NAME: Gabriella Hawkins    MR#:  [id number]  DATE OF BIRTH:  [birth year]  SUBJECTIVE:  CHIEF COMPLAINT:  Pt is doing better. More awake and able to answer questions  REVIEW OF SYSTEMS:  CONSTITUTIONAL: No fever, fatigue or weakness.  EYES: No blurred or double vision.  EARS, NOSE, AND THROAT: No tinnitus or ear pain.  RESPIRATORY: No cough, shortness of breath, wheezing or hemoptysis.  CARDIOVASCULAR: No chest pain, orthopnea, edema.  GASTROINTESTINAL: No nausea, vomiting, diarrhea or abdominal pain.  GENITOURINARY: No dysuria, hematuria.  ENDOCRINE: No polyuria, nocturia,  HEMATOLOGY: No anemia, easy bruising or bleeding SKIN: No rash or lesion. MUSCULOSKELETAL: No joint pain or arthritis.   NEUROLOGIC: No tingling, numbness, weakness.  PSYCHIATRY: No anxiety or depression.   DRUG ALLERGIES:   Allergies  Allergen Reactions  . Phenobarbital     Doesn't remember  . Tramadol     Other reaction(s): Unknown  . Lisinopril Other (See Comments)    dizzy    VITALS:  Blood pressure (!) 163/73, pulse 83, temperature 98.1 F (36.7 C), temperature source Oral, resp. rate 18, height 5\' 2"  (1.575 m), weight 62.7 kg, SpO2 98 %.  PHYSICAL EXAMINATION:  GENERAL:  85 y.o.-year-old patient lying in the bed with no acute distress.  EYES: Pupils equal, round, reactive to light and accommodation. No scleral icterus. Extraocular muscles intact.  HEENT: Head atraumatic, normocephalic. Oropharynx and nasopharynx clear.  NECK:  Supple, no jugular venous distention. No thyroid enlargement, no tenderness.  LUNGS: Normal breath sounds bilaterally, no wheezing, rales,rhonchi or crepitation. No use of accessory muscles of respiration.  CARDIOVASCULAR: S1, S2 normal. No murmurs, rubs, or gallops.  ABDOMEN: Soft, nontender, nondistended. Bowel sounds present. EXTREMITIES: No pedal edema, cyanosis, or clubbing.  NEUROLOGIC: Awake,  alert and oriented x2-3 sensation intact. Gait not checked.  PSYCHIATRIC: The patient is alert and oriented x 2- 3.  SKIN: No obvious rash, lesion, or ulcer.    LABORATORY PANEL:   CBC Recent Labs  Lab 12/07/18 2256  WBC 11.9*  HGB 13.3  HCT 39.0  PLT 234   ------------------------------------------------------------------------------------------------------------------  Chemistries  Recent Labs  Lab 12/07/18 2256 12/08/18 0229  NA 129* 131*  K 3.2* 3.3*  CL 91* 97*  CO2 22 22  GLUCOSE 183* 152*  BUN 57* 54*  CREATININE 2.58* 2.25*  CALCIUM 9.1 8.5*  AST 44*  --   ALT 38  --   ALKPHOS 76  --   BILITOT 1.0  --    ------------------------------------------------------------------------------------------------------------------  Cardiac Enzymes Recent Labs  Lab 12/07/18 2256  TROPONINI <0.03   ------------------------------------------------------------------------------------------------------------------  RADIOLOGY:  Ct Head Wo Contrast  Result Date: 12/07/2018 CLINICAL DATA:  Altered mental status EXAM: CT HEAD WITHOUT CONTRAST TECHNIQUE: Contiguous axial images were obtained from the base of the skull through the vertex without intravenous contrast. COMPARISON:  None. FINDINGS: Brain: There is no mass, hemorrhage or extra-axial collection. There is generalized atrophy without lobar predilection. Hypodensity of the white matter is most commonly associated with chronic microvascular disease. Vascular: Atherosclerotic calcification of the vertebral and internal carotid arteries at the skull base. No abnormal hyperdensity of the major intracranial arteries or dural venous sinuses. Skull: The visualized skull base, calvarium and extracranial soft tissues are normal. Sinuses/Orbits: No fluid levels or advanced mucosal thickening of the visualized paranasal sinuses. No mastoid or middle ear effusion. The orbits are normal. IMPRESSION: Atrophy and chronic small vessel  ischemia without acute  abnormality. Electronically Signed   By: Deatra RobinsonKevin  Herman M.D.   On: 12/07/2018 23:43   Dg Chest Port 1 View  Result Date: 12/07/2018 CLINICAL DATA:  Confusion EXAM: PORTABLE CHEST 1 VIEW COMPARISON:  02/22/2018 FINDINGS: Prior CABG. Cardiomegaly. Large hiatal hernia. No confluent airspace opacities or effusions. No acute bony abnormality. IMPRESSION: Cardiomegaly.  Large hiatal hernia.  No active disease. Electronically Signed   By: Charlett NoseKevin  Dover M.D.   On: 12/07/2018 23:42    EKG:   Orders placed or performed during the hospital encounter of 12/07/18  . EKG 12-Lead  . EKG 12-Lead    ASSESSMENT AND PLAN:    Acute metabolic encephalopathy-possibly due to dementia and or kidney failure.   Clinically improving Aspiration and falls precautions.   No additional interventions due to patient becoming hospice/comfort care.  Acute on chronic kidney failure-  Gentle hydration with IV fluids for nephews request  Avoid nephrotoxins  Acute hyponatremia-possibly due to kidney failure.  Sodium is better 129-131   Acute hypokalemia- potassium 40 mEq x 1  DNR-  admitting physician has reviewed with the patient's nephew her diagnoses, treatments, prognosis.  The patient's nephew decided to make the patient hospice/comfort care.   Palliative care consult pending at this time  case management consult   All the records are reviewed and case discussed with Care Management/Social Workerr. Management plans discussed with the patient, nephew Valentina LucksGriffin over phone [phone] and they are in agreement.  CODE STATUS: DNR  TOTAL TIME TAKING CARE OF THIS PATIENT: 35  minutes.   POSSIBLE D/C IN  DAYS, DEPENDING ON CLINICAL CONDITION.  Note: This dictation was prepared with Dragon dictation along with smaller phrase technology. Any transcriptional errors that result from this process are unintentional.   Ramonita LabAruna Demian Maisel M.D on 12/08/2018 at 1:34 PM  Between 7am to 6pm - Pager -  [phone] After 6pm go to [email or web address] - password [email or web address] [facility]  [facility]  Office  [phone]  CC: Primary care physician; Danella PentonMiller, Mark F, MD

## 2018-12-08 NOTE — ED Notes (Signed)
This tech assisted ambulation with pt around room. Pt ambulation steady with shuffling gait.

## 2018-12-08 NOTE — ED Notes (Signed)
Pt assisted with bedpan with return of concentrated yellow urine. Pt placed back on cardiac monitor and cont pox with oxygen after removing.

## 2018-12-08 NOTE — TOC Initial Note (Signed)
Transition of Care [facility]) - Initial/Assessment Note    Patient Details  Name: Gabriella Hawkins MRN: [id number] Date of Birth: 10/28/1919  Transition of Care [facility]) CM/SW Contact:    Allayne Butcher, RN Phone Number: 12/08/2018, 2:07 PM  Clinical Narrative:                 Patient is from [facility].  Closest living relative is patient's nephew Carroll Sage.  Aneta Mins reports that patient has been declining and would like to have a referral to hospice.  Aneta Mins chooses [facility]- referral placed.  Aneta Mins reports that he has also been talking with a company called [facility] and they will be able to help with sitters to be with the patient during the day.  Patient has a cane at home but has been falling so may need additional equipment.  A consult has been placed for palliative care but they will not be here until Monday.  RNCM will cont to follow.    Expected Discharge Plan: Home w Hospice Care Barriers to Discharge: Continued Medical Work up   Patient Goals and CMS Choice Patient states their goals for this hospitalization and ongoing recovery are:: Nephew would like for patient to be referred to Hospice CMS Medicare.gov Compare Post Acute Care list provided to:: Patient Represenative (must comment)(Nephew Aneta Mins) Choice offered to / list presented to : [facility] POA / Guardian  Expected Discharge Plan and Services Expected Discharge Plan: Home w Hospice Care In-house Referral: Hospice / Palliative Care Discharge Planning Services: CM Consult Post Acute Care Choice: Hospice Living arrangements for the past 2 months: Independent Living Facility                                      Prior Living Arrangements/Services Living arrangements for the past 2 months: Independent Living Facility Lives with:: Self Patient language and need for interpreter reviewed:: Yes        Need for Family Participation in Patient Care: Yes (Comment) Care  giver support system in place?: Yes (comment)(Nephew is closest relative)   Criminal Activity/Legal Involvement Pertinent to Current Situation/Hospitalization: No - Comment as needed  Activities of Daily Living Home Assistive Devices/Equipment: Cane (specify quad or straight) ADL Screening (condition at time of admission) Patient's cognitive ability adequate to safely complete daily activities?: No Is the patient deaf or have difficulty hearing?: No Does the patient have difficulty seeing, even when wearing glasses/contacts?: No Does the patient have difficulty concentrating, remembering, or making decisions?: Yes Patient able to express need for assistance with ADLs?: Yes Does the patient have difficulty dressing or bathing?: Yes Independently performs ADLs?: Yes (appropriate for developmental age) Does the patient have difficulty walking or climbing stairs?: Yes Weakness of Legs: Both Weakness of Arms/Hands: Both  Permission Sought/Granted Permission sought to share information with : Other (comment) Permission granted to share information with : Yes, Verbal Permission Granted     Permission granted to share info w AGENCY: [facility]        Emotional Assessment Appearance:: Appears stated age       Alcohol / Substance Use: Not Applicable Psych Involvement: No (comment)  Admission diagnosis:  Dehydration [E86.0] Hyponatremia [E87.1] AKI (acute kidney injury) (HCC) [N17.9] Acute kidney failure (HCC) [N17.9] Altered mental status, unspecified altered mental status type [R41.82] Patient Active Problem List   Diagnosis Date Noted  . Acute  kidney failure (HCC) 12/08/2018  . Chest pain 05/17/2017   PCP:  Danella PentonMiller, Mark F, MD Pharm[facility]:   [facility] [phone] [place] - [address] [facility] [address] [place] [id number] Phone: [phone] Fax: [phone]     Social Determinants of Health (SDOH) Interventions     Readmission Risk Interventions No flowsheet data found.

## 2018-12-08 NOTE — Care Management Obs Status (Signed)
MEDICARE OBSERVATION STATUS NOTIFICATION   Patient Details  Name: Gabriella Hawkins MRN: [id number] Date of Birth: 1920/06/21   Medicare Observation Status Notification Given:  Yes    Allayne Butcher, RN 12/08/2018, 2:03 PM

## 2018-12-08 NOTE — ED Notes (Signed)
Per floor RN they are waiting on a low bed for pt before pt can come to floor.

## 2018-12-08 NOTE — Plan of Care (Signed)
  Problem: Education: Goal: Knowledge of General Education information will improve Description Including pain rating scale, medication(s)/side effects and non-pharmacologic comfort measures Outcome: Not Progressing Note:  Patient is stated to have underlying dementia w/ a family member considering making her a palliative care patient with an intention for hospice service(s). Will continue to monitor overall progression. Palliative care nurse to see patient Monday (per notes). Jari Favre [facility]

## 2018-12-08 NOTE — TOC Progression Note (Signed)
Transition of Care [facility]) - Progression Note    Patient Details  Name: Gabriella Hawkins MRN: [id number] Date of Birth: 1919-12-30  Transition of Care [facility]) CM/SW Contact  Allayne Butcher, RN Phone Number: 12/08/2018, 3:13 PM  Clinical Narrative:    Referral to [facility] faxed to [phone]- Nurse will reach out and contact Imagene Riches on Monday.    Expected Discharge Plan: Home w Hospice Care Barriers to Discharge: Continued Medical Work up  Expected Discharge Plan and Services Expected Discharge Plan: Home w Hospice Care In-house Referral: Hospice / Palliative Care Discharge Planning Services: CM Consult Post Acute Care Choice: Hospice Living arrangements for the past 2 months: Independent Living Facility                                       Social Determinants of Health (SDOH) Interventions    Readmission Risk Interventions No flowsheet data found.

## 2018-12-08 NOTE — Plan of Care (Signed)
  Problem: Education: Goal: Knowledge of General Education information will improve Description Including pain rating scale, medication(s)/side effects and non-pharmacologic comfort measures Outcome: Progressing   Problem: Pain Managment: Goal: General experience of comfort will improve Outcome: Progressing   Problem: Safety: Goal: Ability to remain free from injury will improve Outcome: Progressing   Problem: Skin Integrity: Goal: Risk for impaired skin integrity will decrease Outcome: Progressing  Patient refuses to be turned q2hr. Will continue to monitor skin

## 2018-12-08 NOTE — ED Notes (Signed)
ED TO INPATIENT HANDOFF REPORT  ED Nurse Name and Phone #: Eileen Stanford [phone]   S Name/Age/Gender Gabriella Hawkins 85 y.o. female Room/Bed: ED15A/ED15A  Code Status   Code Status: DNR  Home/SNF/Other Nursing Home Patient oriented to: self Is this baseline? Yes   Triage Complete: Triage complete  Chief Complaint AMS  Triage Note Pt [facility] from [facility] where she has been altered for the past 30 mins. She was wandering around stating that [place] Pt has fallen 3 times recently and was not checked out.    Allergies Allergies  Allergen Reactions  . Phenobarbital     Doesn't remember  . Tramadol     Other reaction(s): Unknown  . Lisinopril Other (See Comments)    dizzy    Level of Care/Admitting Diagnosis ED Disposition    ED Disposition Condition Comment   Admit  Hospital Area: [facility] [[id number]]  Level of Care: Med-Surg [16]  Covid Evaluation: N/A  Diagnosis: Acute kidney failure [facility]) [350757]  Admitting Physician: Raliegh Scarlet [[id number]]  Attending Physician: Hannah Beat [[id number]]  PT Class (Do Not Modify): Observation [104]  PT Acc Code (Do Not Modify): Observation [10022]       B Medical/Surgery History Past Medical History:  Diagnosis Date  . CAD (coronary artery disease)   . Carotid atherosclerosis   . CVA (cerebral vascular accident) (HCC)   . Hypertension    Past Surgical History:  Procedure Laterality Date  . APPENDECTOMY    . CORONARY ARTERY BYPASS GRAFT    . RETINAL DETACHMENT SURGERY    . THYROID SURGERY       A IV Location/Drains/Wounds Patient Lines/Drains/Airways Status   Active Line/Drains/Airways    Name:   Placement date:   Placement time:   Site:   Days:   Peripheral IV 12/07/18 Left Antecubital   12/07/18    2257    Antecubital   1          Intake/Output Last 24 hours No intake or output data in the 24 hours ending 12/08/18  0401  Labs/Imaging Results for orders placed or performed during the hospital encounter of 12/07/18 (from the past 48 hour(s))  Comprehensive metabolic panel     Status: Abnormal   Collection Time: 12/07/18 10:56 PM  Result Value Ref Range   Sodium 129 (L) 135 - 145 mmol/L   Potassium 3.2 (L) 3.5 - 5.1 mmol/L   Chloride 91 (L) 98 - 111 mmol/L   CO2 22 22 - 32 mmol/L   Glucose, Bld 183 (H) 70 - 99 mg/dL   BUN 57 (H) 8 - 23 mg/dL   Creatinine, Ser 2.17 (H) 0.44 - 1.00 mg/dL   Calcium 9.1 8.9 - 98.1 mg/dL   Total Protein 7.4 6.5 - 8.1 g/dL   Albumin 4.0 3.5 - 5.0 g/dL   AST 44 (H) 15 - 41 U/L   ALT 38 0 - 44 U/L   Alkaline Phosphatase 76 38 - 126 U/L   Total Bilirubin 1.0 0.3 - 1.2 mg/dL   GFR calc non Af Amer 15 (L) >60 mL/min   GFR calc Af Amer 18 (L) >60 mL/min   Anion gap 16 (H) 5 - 15    Comment: Performed at [facility], [address], [place], [place] [address]  CBC     Status: Abnormal   Collection Time: 12/07/18 10:56 PM  Result Value Ref Range  WBC 11.9 (H) 4.0 - 10.5 K/uL   RBC 4.57 3.87 - 5.11 MIL/uL   Hemoglobin 13.3 12.0 - 15.0 g/dL   HCT 16.1 09.6 - 04.5 %   MCV 85.3 80.0 - 100.0 fL   MCH 29.1 26.0 - 34.0 pg   MCHC 34.1 30.0 - 36.0 g/dL   RDW 40.9 81.1 - 91.4 %   Platelets 234 150 - 400 K/uL   nRBC 0.0 0.0 - 0.2 %    Comment: Performed at [facility], [address] [place], [place] [address]  Troponin I - Add-On to previous collection     Status: None   Collection Time: 12/07/18 10:56 PM  Result Value Ref Range   Troponin I <0.03 <0.03 ng/mL    Comment: Performed at [facility], [address] [place], [place] [address]  Urinalysis, Complete w Microscopic     Status: Abnormal   Collection Time: 12/07/18 11:51 PM  Result Value Ref Range   Color, Urine YELLOW (A) YELLOW   APPearance HAZY (A) CLEAR   Specific Gravity, Urine 1.019 1.005 - 1.030   pH 5.0 5.0 - 8.0   Glucose, UA NEGATIVE NEGATIVE mg/dL   Hgb urine  dipstick NEGATIVE NEGATIVE   Bilirubin Urine NEGATIVE NEGATIVE   Ketones, ur NEGATIVE NEGATIVE mg/dL   Protein, ur NEGATIVE NEGATIVE mg/dL   Nitrite NEGATIVE NEGATIVE   Leukocytes,Ua NEGATIVE NEGATIVE   RBC / HPF 0-5 0 - 5 RBC/hpf   WBC, UA 0-5 0 - 5 WBC/hpf   Bacteria, UA NONE SEEN NONE SEEN   Squamous Epithelial / LPF NONE SEEN 0 - 5   Mucus PRESENT    Hyaline Casts, UA PRESENT     Comment: Performed at [facility], [address] [place], [place] [address]  Basic metabolic panel     Status: Abnormal   Collection Time: 12/08/18  2:29 AM  Result Value Ref Range   Sodium 131 (L) 135 - 145 mmol/L   Potassium 3.3 (L) 3.5 - 5.1 mmol/L   Chloride 97 (L) 98 - 111 mmol/L   CO2 22 22 - 32 mmol/L   Glucose, Bld 152 (H) 70 - 99 mg/dL   BUN 54 (H) 8 - 23 mg/dL   Creatinine, Ser 4.69 (H) 0.44 - 1.00 mg/dL   Calcium 8.5 (L) 8.9 - 10.3 mg/dL   GFR calc non Af Amer 18 (L) >60 mL/min   GFR calc Af Amer 21 (L) >60 mL/min   Anion gap 12 5 - 15    Comment: Performed at [facility], [address] [place], [place] [address]  Lactic acid, plasma     Status: None   Collection Time: 12/08/18  2:29 AM  Result Value Ref Range   Lactic Acid, Venous 0.9 0.5 - 1.9 mmol/L    Comment: Performed at [facility], [address] [place], [place] [address]   Ct Head Wo Contrast  Result Date: 12/07/2018 CLINICAL DATA:  Altered mental status EXAM: CT HEAD WITHOUT CONTRAST TECHNIQUE: Contiguous axial images were obtained from the base of the skull through the vertex without intravenous contrast. COMPARISON:  None. FINDINGS: Brain: There is no mass, hemorrhage or extra-axial collection. There is generalized atrophy without lobar predilection. Hypodensity of the white matter is most commonly associated with chronic microvascular disease. Vascular: Atherosclerotic calcification of the vertebral and internal carotid arteries at the skull base. No abnormal hyperdensity of the major  intracranial arteries or dural venous sinuses. Skull: The visualized skull  base, calvarium and extracranial soft tissues are normal. Sinuses/Orbits: No fluid levels or advanced mucosal thickening of the visualized paranasal sinuses. No mastoid or middle ear effusion. The orbits are normal. IMPRESSION: Atrophy and chronic small vessel ischemia without acute abnormality. Electronically Signed   By: Deatra RobinsonKevin  Herman M.D.   On: 12/07/2018 23:43   Dg Chest Port 1 View  Result Date: 12/07/2018 CLINICAL DATA:  Confusion EXAM: PORTABLE CHEST 1 VIEW COMPARISON:  02/22/2018 FINDINGS: Prior CABG. Cardiomegaly. Large hiatal hernia. No confluent airspace opacities or effusions. No acute bony abnormality. IMPRESSION: Cardiomegaly.  Large hiatal hernia.  No active disease. Electronically Signed   By: Charlett NoseKevin  Dover M.D.   On: 12/07/2018 23:42    Pending Labs Unresulted Labs (From admission, onward)    Start     Ordered   12/07/18 2311  Urine culture  Once,   STAT     12/07/18 2311          Vitals/Pain Today's Vitals   12/08/18 0030 12/08/18 0100 12/08/18 0200 12/08/18 0230  BP: (!) 130/95 (!) 111/93 120/63 139/72  Pulse: 80 79 77 84  Resp: 19 18 19 20   Temp:      TempSrc:      SpO2: 99% 98% 97% 95%  Weight:      Height:      PainSc:        Isolation Precautions No active isolations  Medications Medications  sodium chloride flush (NS) 0.9 % injection 3 mL ( Intravenous Canceled Entry 12/07/18 2247)  acetaminophen (TYLENOL) tablet 650 mg (has no administration in time range)    Or  acetaminophen (TYLENOL) suppository 650 mg (has no administration in time range)  senna-docusate (Senokot-S) tablet 1 tablet (has no administration in time range)  ondansetron (ZOFRAN) tablet 4 mg (has no administration in time range)    Or  ondansetron (ZOFRAN) injection 4 mg (has no administration in time range)  aspirin EC tablet 81 mg (has no administration in time range)  sodium chloride 0.9 % bolus 1,000 mL (0  mLs Intravenous Stopped 12/08/18 0230)    Mobility walks with person assist High fall risk   Focused Assessments Cardiac Assessment Handoff:    Lab Results  Component Value Date   TROPONINI <0.03 12/07/2018   No results found for: DDIMER Does the Patient currently have chest pain? No  , Neuro Assessment Handoff:  Swallow screen pass? not ordered         Neuro Assessment: Within Defined Limits Neuro Checks:      Last Documented NIHSS Modified Score:   Has TPA been given? No If patient is a Neuro Trauma and patient is going to OR before floor call report to 4N Charge nurse: [phone] or [phone]     R Recommendations: See Admitting Provider Note  Report given to:   Additional Notes:

## 2018-12-08 NOTE — Evaluation (Signed)
Clinical/Bedside Swallow Evaluation Patient Details  Name: Gabriella Hawkins MRN: [id number] Date of Birth: 03/22/1920  Today's Date: 12/08/2018 Time: SLP Start Time (ACUTE ONLY): 0900 SLP Stop Time (ACUTE ONLY): 0947 SLP Time Calculation (min) (ACUTE ONLY): 47 min  Past Medical History:  Past Medical History:  Diagnosis Date  . CAD (coronary artery disease)   . Carotid atherosclerosis   . CVA (cerebral vascular accident) (HCC)   . Hypertension    Past Surgical History:  Past Surgical History:  Procedure Laterality Date  . APPENDECTOMY    . CORONARY ARTERY BYPASS GRAFT    . RETINAL DETACHMENT SURGERY    . THYROID SURGERY     HPI:  H&P 12/08/2018: Gabriella Hawkins  is a 85 y.o. female with a known history of possible dementia, CAD, CVA presents the ED with complaints of altered mental status from her assisted living facility 12/07/18.  Per ED, the assisted-living facility stated the patient was confused, wandering around, stating [place]  Patient cannot give a history due to altered mental status.  Spoke with her nephew and he stated that the patient has been mentally and physically declining over the last month.  He believes the patient has dementia because her twin sister died a few years ago with it.  The patient nephew does not want any additional medical interventions.  Patient's nephew want to place her under hospice and comfort care.  SLP consult for determining best texture for diet.   Assessment / Plan / Recommendation Clinical Impression  The patient is presenting with no significant indicators of pharyngeal dysphagia.  She does complain that some foods are "too hard" even with her dentures.  The patient was agreeable to modifying her diet to mechanical soft/dysphagia 3.  The patient does indicate that her appetite has waned, and she did not eat much of her breakfast.  The patient does not have further need for speech therapy,  will sign off at this time. SLP Visit Diagnosis: Dysphagia, oral phase (R13.11)    Aspiration Risk  No limitations    Diet Recommendation Dysphagia 3 (Mech soft);Thin liquid   Liquid Administration via: Cup;Straw Medication Administration: Whole meds with liquid Supervision: Patient able to self feed Postural Changes: Seated upright at 90 degrees;Remain upright for at least 30 minutes after po intake    Other  Recommendations Oral Care Recommendations: Oral care BID   Follow up Recommendations None      Frequency and Duration            Prognosis Prognosis for Safe Diet Advancement: Good      Swallow Study   General Date of Onset: 12/08/18 HPI: H&P 12/08/2018: Gabriella Hawkins  is a 85 y.o. female with a known history of possible dementia, CAD, CVA presents the ED with complaints of altered mental status from her assisted living facility 12/07/18.  Per ED, the assisted-living facility stated the patient was confused, wandering around, stating [place].  Patient cannot give a history due to altered mental status.  Spoke with her nephew and he stated that the patient has been mentally and physically declining over the last month.  He believes the patient has dementia because her twin sister died a few years ago with it.  The patient nephew does not want any additional medical interventions.  Patient's nephew want to place her under hospice and comfort care.  SLP consult for determining  best texture for diet. Type of Study: Bedside Swallow Evaluation Previous Swallow Assessment: None Diet Prior to this Study: Regular;Thin liquids History of Recent Intubation: No Behavior/Cognition: Alert;Cooperative;Pleasant mood Oral Cavity Assessment: Within Functional Limits Oral Cavity - Dentition: Dentures, top;Dentures, bottom Self-Feeding Abilities: Able to feed self Patient Positioning: Upright in bed Baseline Vocal Quality: Normal Volitional  Cough: Strong Volitional Swallow: Able to elicit    Oral/Motor/Sensory Function Overall Oral Motor/Sensory Function: Within functional limits   Ice Chips     Thin Liquid Thin Liquid: Within functional limits Presentation: Cup;Self Fed;Straw    Nectar Thick     Honey Thick     Puree Puree: Within functional limits Presentation: Self Fed;Spoon   Solid     Solid: Impaired Presentation: Self Fed Oral Phase Impairments: Impaired mastication;Other (comment)(pt not able to masticate hard sausage patty, no other diffic) Pharyngeal Phase Impairments: Other (comments)(NO pharyngeal phase impairments)     Dollene Primrose, MS/CCC- SLP  Tedd Sias, Lynnell Dike 12/08/2018,10:28 AM

## 2018-12-09 LAB — BASIC METABOLIC PANEL
Anion gap: 11 (ref 5–15)
BUN: 23 mg/dL (ref 8–23)
CO2: 26 mmol/L (ref 22–32)
Calcium: 8.5 mg/dL — ABNORMAL LOW (ref 8.9–10.3)
Chloride: 96 mmol/L — ABNORMAL LOW (ref 98–111)
Creatinine, Ser: 1.13 mg/dL — ABNORMAL HIGH (ref 0.44–1.00)
GFR calc Af Amer: 48 mL/min — ABNORMAL LOW (ref >60)
GFR calc non Af Amer: 41 mL/min — ABNORMAL LOW (ref >60)
Glucose, Bld: 106 mg/dL — ABNORMAL HIGH (ref 70–99)
Potassium: 3.4 mmol/L — ABNORMAL LOW (ref 3.5–5.1)
Sodium: 133 mmol/L — ABNORMAL LOW (ref 135–145)

## 2018-12-09 LAB — URINE CULTURE: Culture: NO GROWTH

## 2018-12-09 LAB — MRSA PCR SCREENING: MRSA by PCR: NEGATIVE

## 2018-12-09 MED ORDER — HALOPERIDOL LACTATE 5 MG/ML IJ SOLN: 1.0000 mg | Freq: Four times a day (QID) | INTRAMUSCULAR | Status: DC | PRN

## 2018-12-09 NOTE — Plan of Care (Signed)
  Problem: Clinical Measurements: Goal: Ability to maintain clinical measurements within normal limits will improve Outcome: Not Progressing Note:  Most recent W.B.C.'s are elevated at 11.9. Will continue to monitor lab values. Jari Favre [facility]

## 2018-12-09 NOTE — Plan of Care (Signed)
  Problem: Education: Goal: Knowledge of General Education information will improve Description Including pain rating scale, medication(s)/side effects and non-pharmacologic comfort measures Outcome: Progressing   Problem: Clinical Measurements: Goal: Diagnostic test results will improve Outcome: Progressing Goal: Cardiovascular complication will be avoided Outcome: Progressing   Problem: Elimination: Goal: Will not experience complications related to urinary retention Outcome: Progressing   Problem: Pain Managment: Goal: General experience of comfort will improve Outcome: Progressing

## 2018-12-09 NOTE — Progress Notes (Addendum)
Went to assess and reposition patient. I asked if I can listen to her lungs.She stated "that she is in pain and hurting really bad." I offered pain medication pt declined. Offered emotional support. Pt. Wanted to be left alone. Will come back later and try again.

## 2018-12-09 NOTE — Progress Notes (Signed)
[facility] [place] [facility]   PATIENT NAME: Gabriella Hawkins    MR#:  [id number]  DATE OF BIRTH:  10/24/1919  SUBJECTIVE:  CHIEF COMPLAINT:  Pt is doing better. More awake and able to answer questions she wants to be [facility] but family is refusing Saying that patient is confused than her baseline and requesting psych consult  REVIEW OF SYSTEMS:  CONSTITUTIONAL: No fever, fatigue or weakness.  EYES: No blurred or double vision.  EARS, NOSE, AND THROAT: No tinnitus or ear pain.  RESPIRATORY: No cough, shortness of breath, wheezing or hemoptysis.  CARDIOVASCULAR: No chest pain, orthopnea, edema.  GASTROINTESTINAL: No nausea, vomiting, diarrhea or abdominal pain.  SKIN: No rash or lesion. MUSCULOSKELETAL: No joint pain or arthritis.   NEUROLOGIC: No tingling, numbness, weakness.  PSYCHIATRY: No anxiety or depression.   DRUG ALLERGIES:   Allergies  Allergen Reactions  . Phenobarbital     Doesn't remember  . Tramadol     Other reaction(s): Unknown  . Lisinopril Other (See Comments)    dizzy    VITALS:  Blood pressure 140/65, pulse 85, temperature 97.6 F (36.4 C), temperature source Oral, resp. rate 20, height 5\' 2"  (1.575 m), weight 62.7 kg, SpO2 93 %.  PHYSICAL EXAMINATION:  GENERAL:  85 y.o.-year-old patient lying in the bed with no acute distress.  EYES: Pupils equal, round, reactive to light and accommodation. No scleral icterus. Extraocular muscles intact.  HEENT: Head atraumatic, normocephalic. Oropharynx and nasopharynx clear.  NECK:  Supple, no jugular venous distention. No thyroid enlargement, no tenderness.  LUNGS: Normal breath sounds bilaterally, no wheezing, rales,rhonchi or crepitation. No use of accessory muscles of respiration.  CARDIOVASCULAR: S1, S2 normal. No murmurs, rubs, or gallops.  ABDOMEN: Soft, nontender, nondistended. Bowel sounds present. EXTREMITIES: No pedal edema, cyanosis, or clubbing.   NEUROLOGIC: Awake, alert and oriented x2-3 sensation intact. Gait not checked.  PSYCHIATRIC: The patient is alert and oriented x 2- 3.  SKIN: No obvious rash, lesion, or ulcer.    LABORATORY PANEL:   CBC Recent Labs  Lab 12/07/18 2256  WBC 11.9*  HGB 13.3  HCT 39.0  PLT 234   ------------------------------------------------------------------------------------------------------------------  Chemistries  Recent Labs  Lab 12/07/18 2256  12/09/18 0431  NA 129*   < > 133*  K 3.2*   < > 3.4*  CL 91*   < > 96*  CO2 22   < > 26  GLUCOSE 183*   < > 106*  BUN 57*   < > 23  CREATININE 2.58*   < > 1.13*  CALCIUM 9.1   < > 8.5*  AST 44*  --   --   ALT 38  --   --   ALKPHOS 76  --   --   BILITOT 1.0  --   --    < > = values in this interval not displayed.   ------------------------------------------------------------------------------------------------------------------  Cardiac Enzymes Recent Labs  Lab 12/07/18 2256  TROPONINI <0.03   ------------------------------------------------------------------------------------------------------------------  RADIOLOGY:  Ct Head Wo Contrast  Result Date: 12/07/2018 CLINICAL DATA:  Altered mental status EXAM: CT HEAD WITHOUT CONTRAST TECHNIQUE: Contiguous axial images were obtained from the base of the skull through the vertex without intravenous contrast. COMPARISON:  None. FINDINGS: Brain: There is no mass, hemorrhage or extra-axial collection. There is generalized atrophy without lobar predilection. Hypodensity of the white matter is most commonly associated with chronic microvascular disease. Vascular: Atherosclerotic calcification of the vertebral and internal carotid  arteries at the skull base. No abnormal hyperdensity of the major intracranial arteries or dural venous sinuses. Skull: The visualized skull base, calvarium and extracranial soft tissues are normal. Sinuses/Orbits: No fluid levels or advanced mucosal thickening of the  visualized paranasal sinuses. No mastoid or middle ear effusion. The orbits are normal. IMPRESSION: Atrophy and chronic small vessel ischemia without acute abnormality. Electronically Signed   By: Deatra Robinson M.D.   On: 12/07/2018 23:43   Dg Chest Port 1 View  Result Date: 12/07/2018 CLINICAL DATA:  Confusion EXAM: PORTABLE CHEST 1 VIEW COMPARISON:  02/22/2018 FINDINGS: Prior CABG. Cardiomegaly. Large hiatal hernia. No confluent airspace opacities or effusions. No acute bony abnormality. IMPRESSION: Cardiomegaly.  Large hiatal hernia.  No active disease. Electronically Signed   By: Charlett Nose M.D.   On: 12/07/2018 23:42    EKG:   Orders placed or performed during the hospital encounter of 12/07/18  . EKG 12-Lead  . EKG 12-Lead    ASSESSMENT AND PLAN:    Acute metabolic encephalopathy-possibly due to dementia and or kidney failure.   Clinically improving Aspiration and falls precautions.   No additional interventions due to patient becoming hospice/comfort care.  Acute on chronic kidney failure-  Gentle hydration with IV fluids for nephews request  Avoid nephrotoxins  Acute hyponatremia-possibly due to kidney failure.  Sodium is better [phone]   Acute hypokalemia- potassium 40 mEq x 1  Altered mental status than her baseline-probably from dementia Psych consult placed, notification sent to psychiatry Dr. Reita May via haiku chat  DNR-  admitting physician has reviewed with the patient's nephew her diagnoses, treatments, prognosis.  The patient's nephew decided to make the patient hospice/comfort care.   Palliative care consult pending at this time  case management consult   All the records are reviewed and case discussed with Care Management/Social Workerr. Management plans discussed with the patient, nephew Gabriella Hawkins over phone [phone] and they are in agreement.  CODE STATUS: DNR  TOTAL TIME TAKING CARE OF THIS PATIENT: 35  minutes.   POSSIBLE D/C IN   DAYS, DEPENDING ON CLINICAL CONDITION.  Note: This dictation was prepared with Dragon dictation along with smaller phrase technology. Any transcriptional errors that result from this process are unintentional.   Ramonita Lab M.D on 12/09/2018 at 1:14 PM  Between 7am to 6pm - Pager - [phone] After 6pm go to [email or web address] - password [email or web address] [facility]  [facility]  Office  [phone]  CC: Primary care physician; Danella Penton, MD

## 2018-12-09 NOTE — Progress Notes (Signed)
Nutrition Brief Note  Patient identified to be seen for Malnutrition Screening Tool (MST). Chart reviewed. Patient now transitioning to comfort care. Pending official Palliative Medicine evaluation.  No nutrition interventions warranted at this time. Please consult RD as needed.   Helane Rima, MS, RD, LDN Office: [phone] Pager: [phone] After Hours/Weekend Pager: [phone]

## 2018-12-09 NOTE — Consult Note (Signed)
[facility] Face-to-Face Psychiatry Consult   Reason for Consult:  Dementia Referring Physician:   Dr. Amado Coe Patient Identification: Gabriella Hawkins MRN:  [id number] Principal Diagnosis:Acute encephalopathy, resolving  Diagnosis:   Acute encephalopathy, resolving  Total Time spent with patient: 1 hour   HPI:   Gabriella Hawkins is a 85 year old Caucasian female with no known psychiatric history who presented to the hospital due to altered mental status.  She lives at [facility], an independent living facility.  Her nephew is her power of attorney and I was able to speak to him today.  According to him, she had been doing well emotionally and physically up until 4 weeks ago when she had a urinary tract infection, and consequently developed some confusion as well as auditory hallucinations.  She was treated with ciprofloxacin which helped a little and then was prescribed another antibiotic but she remained somewhat confused.  Last Friday, she was in the parking lot of the facility that she lives in, and was awaiting a family member to pick her up.  There was no such plans for her to discharge from there.  She has no history of overt psychosis but was noted to have auditory hallucinations for a few days when she had a urinary tract infection 4 years ago; it resolved once treated appropriately.  The family is worried that she may not be able to function at an independent living facility while she has altered mental status.  Note no history of dementia or major neurocognitive disorder.  Today, patient is alert orientated x2.  She converses well.  She denies having stressors and reports that her prevailing mood has been happy.  She had sustained 2 falls last week but hit her elbow and not her head.  She no longer feels confused.  She does recall having auditory hallucinations a few weeks ago but denies having them today.  Says that she sleeps fairly well and her appetite is intact.  No symptoms of mania,  OCD, anxiety, or PTSD.  She tells me that she is 72 and her husband passed away 12 years ago.  Indicates "I have lived a good life."  She denies have any suicidal ideations.  No bouts of aggression or poor temperament in the past.  Actively, tells me that she is feeling much better over the past couple days  No history of psychiatric admissions.  No psychiatric history.  No medication trials.   Past Psychiatric History:  No history of psychiatric admissions.  No psychiatric history.  No medication trials.  Past Medical History:  Past Medical History:  Diagnosis Date  . CAD (coronary artery disease)   . Carotid atherosclerosis   . CVA (cerebral vascular accident) (HCC)   . Hypertension     Past Surgical History:  Procedure Laterality Date  . APPENDECTOMY    . CORONARY ARTERY BYPASS GRAFT    . RETINAL DETACHMENT SURGERY    . THYROID SURGERY     Family History:  Family History  Problem Relation Age of Onset  . Hypertension Neg Hx   . Diabetes Mellitus II Neg Hx    Family Psychiatric  History: HX of dementia.  Social History:   She is widowed.  She never had kids.  She is not working at this time.  Nephew is her power of attorney. Twins sister pasted away a few years ago. She had dementia.    Social History   Substance and Sexual Activity  Alcohol Use No     Social  History   Substance and Sexual Activity  Drug Use No    Social History   Socioeconomic History  . Marital status: Widowed    Spouse name: Not on file  . Number of children: Not on file  . Years of education: Not on file  . Highest education level: Not on file  Occupational History  . Occupation: retired  Engineer, production  . Financial resource strain: Not on file  . Food insecurity:    Worry: Not on file    Inability: Not on file  . Transportation needs:    Medical: Not on file    Non-medical: Not on file  Tobacco Use  . Smoking status: Never Smoker  . Smokeless tobacco: Never Used  Substance and  Sexual Activity  . Alcohol use: No  . Drug use: No  . Sexual activity: Never  Lifestyle  . Physical activity:    Days per week: Not on file    Minutes per session: Not on file  . Stress: Not on file  Relationships  . Social connections:    Talks on phone: Not on file    Gets together: Not on file    Attends religious service: Not on file    Active member of club or organization: Not on file    Attends meetings of clubs or organizations: Not on file    Relationship status: Not on file  Other Topics Concern  . Not on file  Social History Narrative  . Not on file   Additional Social History:    Allergies:   Allergies  Allergen Reactions  . Phenobarbital     Doesn't remember  . Tramadol     Other reaction(s): Unknown  . Lisinopril Other (See Comments)    dizzy    Labs:  Results for orders placed or performed during the hospital encounter of 12/07/18 (from the past 48 hour(s))  Comprehensive metabolic panel     Status: Abnormal   Collection Time: 12/07/18 10:56 PM  Result Value Ref Range   Sodium 129 (L) 135 - 145 mmol/L   Potassium 3.2 (L) 3.5 - 5.1 mmol/L   Chloride 91 (L) 98 - 111 mmol/L   CO2 22 22 - 32 mmol/L   Glucose, Bld 183 (H) 70 - 99 mg/dL   BUN 57 (H) 8 - 23 mg/dL   Creatinine, Ser 2.58 (H) 0.44 - 1.00 mg/dL   Calcium 9.1 8.9 - 52.7 mg/dL   Total Protein 7.4 6.5 - 8.1 g/dL   Albumin 4.0 3.5 - 5.0 g/dL   AST 44 (H) 15 - 41 U/L   ALT 38 0 - 44 U/L   Alkaline Phosphatase 76 38 - 126 U/L   Total Bilirubin 1.0 0.3 - 1.2 mg/dL   GFR calc non Af Amer 15 (L) >60 mL/min   GFR calc Af Amer 18 (L) >60 mL/min   Anion gap 16 (H) 5 - 15    Comment: Performed at [facility], [address] [place], [place] [address]  CBC     Status: Abnormal   Collection Time: 12/07/18 10:56 PM  Result Value Ref Range   WBC 11.9 (H) 4.0 - 10.5 K/uL   RBC 4.57 3.87 - 5.11 MIL/uL   Hemoglobin 13.3 12.0 - 15.0 g/dL   HCT 35.3 61.4 - 43.1 %   MCV 85.3 80.0 - 100.0 fL    MCH 29.1 26.0 - 34.0 pg   MCHC 34.1 30.0 - 36.0 g/dL   RDW 54.0 08.6 -  15.5 %   Platelets 234 150 - 400 K/uL   nRBC 0.0 0.0 - 0.2 %    Comment: Performed at [facility], [address] [place], [place] [address]  Troponin I - Add-On to previous collection     Status: None   Collection Time: 12/07/18 10:56 PM  Result Value Ref Range   Troponin I <0.03 <0.03 ng/mL    Comment: Performed at [facility], [address] [place], [place] [address]  Urinalysis, Complete w Microscopic     Status: Abnormal   Collection Time: 12/07/18 11:51 PM  Result Value Ref Range   Color, Urine YELLOW (A) YELLOW   APPearance HAZY (A) CLEAR   Specific Gravity, Urine 1.019 1.005 - 1.030   pH 5.0 5.0 - 8.0   Glucose, UA NEGATIVE NEGATIVE mg/dL   Hgb urine dipstick NEGATIVE NEGATIVE   Bilirubin Urine NEGATIVE NEGATIVE   Ketones, ur NEGATIVE NEGATIVE mg/dL   Protein, ur NEGATIVE NEGATIVE mg/dL   Nitrite NEGATIVE NEGATIVE   Leukocytes,Ua NEGATIVE NEGATIVE   RBC / HPF 0-5 0 - 5 RBC/hpf   WBC, UA 0-5 0 - 5 WBC/hpf   Bacteria, UA NONE SEEN NONE SEEN   Squamous Epithelial / LPF NONE SEEN 0 - 5   Mucus PRESENT    Hyaline Casts, UA PRESENT     Comment: Performed at [facility], [address], [place], [place] [address]  Urine culture     Status: None   Collection Time: 12/07/18 11:51 PM  Result Value Ref Range   Specimen Description      URINE, CATHETERIZED Performed at [facility], [address], [place], [place] [address]    Special Requests      NONE Performed at [facility], [address] [place], [place] [address]    Culture      NO GROWTH Performed at [facility], [address] [place], [place] [address]    Report Status 12/09/2018 FINAL   Basic metabolic panel     Status: Abnormal   Collection Time: 12/08/18  2:29 AM  Result Value Ref Range   Sodium 131 (L) 135 - 145 mmol/L   Potassium 3.3 (L) 3.5 - 5.1 mmol/L   Chloride  97 (L) 98 - 111 mmol/L   CO2 22 22 - 32 mmol/L   Glucose, Bld 152 (H) 70 - 99 mg/dL   BUN 54 (H) 8 - 23 mg/dL   Creatinine, Ser 5.78 (H) 0.44 - 1.00 mg/dL   Calcium 8.5 (L) 8.9 - 10.3 mg/dL   GFR calc non Af Amer 18 (L) >60 mL/min   GFR calc Af Amer 21 (L) >60 mL/min   Anion gap 12 5 - 15    Comment: Performed at [facility], [address] [place], [place] [address]  Lactic acid, plasma     Status: None   Collection Time: 12/08/18  2:29 AM  Result Value Ref Range   Lactic Acid, Venous 0.9 0.5 - 1.9 mmol/L    Comment: Performed at [facility], [address] [place], [place] [address]  MRSA PCR Screening     Status: None   Collection Time: 12/09/18  4:23 AM  Result Value Ref Range   MRSA by PCR NEGATIVE NEGATIVE    Comment:        The GeneXpert MRSA Assay (FDA approved for NASAL specimens only), is one component of a comprehensive MRSA colonization surveillance program. It is not intended  to diagnose MRSA infection nor to guide or monitor treatment for MRSA infections. Performed at [facility], [address] [place], [place] [address]   Basic metabolic panel     Status: Abnormal   Collection Time: 12/09/18  4:31 AM  Result Value Ref Range   Sodium 133 (L) 135 - 145 mmol/L   Potassium 3.4 (L) 3.5 - 5.1 mmol/L   Chloride 96 (L) 98 - 111 mmol/L   CO2 26 22 - 32 mmol/L   Glucose, Bld 106 (H) 70 - 99 mg/dL   BUN 23 8 - 23 mg/dL   Creatinine, Ser 9.14 (H) 0.44 - 1.00 mg/dL   Calcium 8.5 (L) 8.9 - 10.3 mg/dL   GFR calc non Af Amer 41 (L) >60 mL/min   GFR calc Af Amer 48 (L) >60 mL/min   Anion gap 11 5 - 15    Comment: Performed at [facility], [address], [place], [place] [address]    Current Facility-Administered Medications  Medication Dose Route Frequency Provider Last Rate Last Dose  . acetaminophen (TYLENOL) tablet 650 mg  650 mg Oral Q6H PRN Raliegh Scarlet, NP       Or  . acetaminophen (TYLENOL) suppository 650 mg   650 mg Rectal Q6H PRN Raliegh Scarlet, NP      . alum & mag hydroxide-simeth (MAALOX/MYLANTA) 200-200-20 MG/5ML suspension 30 mL  30 mL Oral Q6H PRN Raliegh Scarlet, NP      . antiseptic oral rinse (BIOTENE) solution 15 mL  15 mL Topical PRN Raliegh Scarlet, NP      . aspirin EC tablet 81 mg  81 mg Oral Daily Raliegh Scarlet, NP   81 mg at 12/09/18 0925  . atropine 1 % ophthalmic solution 4 drop  4 drop Sublingual Q4H PRN Raliegh Scarlet, NP      . baclofen (LIORESAL) tablet 5 mg  5 mg Oral Q12H PRN Raliegh Scarlet, NP      . bisacodyl (DULCOLAX) suppository 10 mg  10 mg Rectal Daily PRN Raliegh Scarlet, NP      . diphenhydrAMINE (BENADRYL) injection 12.5 mg  12.5 mg Intravenous Q4H PRN Raliegh Scarlet, NP      . haloperidol (HALDOL) tablet 0.5 mg  0.5 mg Oral Q4H PRN Mansy, Jan A, MD       Or  . haloperidol (HALDOL) 2 MG/ML solution 0.5 mg  0.5 mg Sublingual Q4H PRN Mansy, Jan A, MD      . haloperidol lactate (HALDOL) injection 1 mg  1 mg Intramuscular Q6H PRN Mansy, Jan A, MD      . loperamide (IMODIUM) capsule 2 mg  2 mg Oral Q2H PRN Raliegh Scarlet, NP      . magic mouthwash  15 mL Oral Q6H PRN Raliegh Scarlet, NP      . morphine 2 MG/ML injection 1 mg  1 mg Intravenous Q2H PRN Raliegh Scarlet, NP   1 mg at 12/09/18 0430  . morphine CONCENTRATE 10 MG/0.5ML oral solution 5 mg  5 mg Oral Q2H PRN Raliegh Scarlet, NP       Or  . morphine CONCENTRATE 10 MG/0.5ML oral solution 5 mg  5 mg Sublingual Q2H PRN Raliegh Scarlet, NP      . ondansetron (ZOFRAN) tablet 4 mg  4 mg Oral Q6H PRN Raliegh Scarlet, NP       Or  . ondansetron (ZOFRAN) injection 4 mg  4 mg Intravenous Q6H PRN Raliegh Scarlet, NP      .  polyvinyl alcohol (LIQUIFILM TEARS) 1.4 % ophthalmic solution 1 drop  1 drop Both Eyes QID PRN Raliegh ScarletSidney, Katrina, NP      . senna-docusate (Senokot-S) tablet 1 tablet  1 tablet Oral QHS PRN Raliegh ScarletSidney, Katrina, NP      . sodium chloride flush (NS) 0.9 % injection 3 mL  3 mL Intravenous Once Irean HongSung, Jade  J, MD      . traZODone (DESYREL) tablet 25 mg  25 mg Oral QHS PRN Raliegh ScarletSidney, Katrina, NP        Musculoskeletal: Strength & Muscle Tone: within normal limits Gait & Station: normal Patient leans: Front  Psychiatric Specialty Exam: Physical Exam  ROS  Blood pressure 140/65, pulse 85, temperature 97.6 F (36.4 C), temperature source Oral, resp. rate 20, height 5\' 2"  (1.575 m), weight 62.7 kg, SpO2 93 %.Body mass index is 25.3 kg/m.  General Appearance: Casual  Eye Contact:  Good  Speech:  Normal Rate  Volume:  Normal  Mood:  Negative  Affect:  Full Range  Thought Process:  Coherent  Orientation:  Full (Time, Place, and Person)  Thought Content:  Logical  Suicidal Thoughts:  No  Homicidal Thoughts:  No  Memory:  Immediate;   Fair  Judgement:  Fair  Insight:  Fair  Psychomotor Activity:  Normal  Concentration:  Concentration: Good  Recall:  Fair  Fund of Knowledge:  Fair  Language:  Good  Akathisia:  No  Handed:  Right  AIMS (if indicated):     Assets:  Communication Skills  ADL's:  Intact  Cognition:  Mild age related impairment.   Sleep:        Treatment Plan Summary: Daily contact with patient to assess and evaluate symptoms and progress in treatment Patient does not meet inpatient psychiatric criteria It seems as though the acute encephalopathy is slowly resolving I did speak with the hospitalist regarding this patient Collateral information obtained from the nephew Patient does not require any psychiatric medications at this time.  Disposition: No evidence of imminent risk to self or others at present.    Reggie PileAnand Latania Bascomb, MD 12/09/2018 3:15 PM

## 2018-12-10 ENCOUNTER — Encounter: Payer: Self-pay | Admitting: Primary Care

## 2018-12-10 DIAGNOSIS — Z515 Encounter for palliative care: Secondary | ICD-10-CM | POA: Diagnosis not present

## 2018-12-10 DIAGNOSIS — N179 Acute kidney failure, unspecified: Secondary | ICD-10-CM

## 2018-12-10 DIAGNOSIS — Z7189 Other specified counseling: Secondary | ICD-10-CM

## 2018-12-10 DIAGNOSIS — E860 Dehydration: Secondary | ICD-10-CM | POA: Diagnosis not present

## 2018-12-10 DIAGNOSIS — R410 Disorientation, unspecified: Secondary | ICD-10-CM | POA: Diagnosis not present

## 2018-12-10 MED ORDER — ATORVASTATIN CALCIUM 20 MG PO TABS: 20.0000 mg | ORAL_TABLET | Freq: Every day | ORAL | Status: DC

## 2018-12-10 MED ORDER — POLYETHYLENE GLYCOL 3350 17 G PO PACK
17.0000 g | PACK | Freq: Every day | ORAL | Status: DC
  Administered 2018-12-10: 17 g via ORAL
  Filled 2018-12-10: qty 1

## 2018-12-10 MED ORDER — LEVOTHYROXINE SODIUM 75 MCG PO TABS: 75.0000 ug | ORAL_TABLET | ORAL | Status: DC

## 2018-12-10 MED ORDER — VITAMIN D3 25 MCG (1000 UNIT) PO TABS
1000.0000 [IU] | ORAL_TABLET | Freq: Every day | ORAL | Status: DC
  Filled 2018-12-10: qty 1

## 2018-12-10 MED ORDER — VITAMIN B-12 1000 MCG PO TABS
1000.0000 ug | ORAL_TABLET | Freq: Every day | ORAL | Status: DC
  Administered 2018-12-10: 1000 ug via ORAL
  Filled 2018-12-10: qty 1

## 2018-12-10 MED ORDER — GABAPENTIN 100 MG PO CAPS: 100.0000 mg | ORAL_CAPSULE | Freq: Every day | ORAL | Status: DC

## 2018-12-10 MED ORDER — CLOPIDOGREL BISULFATE 75 MG PO TABS
75.0000 mg | ORAL_TABLET | Freq: Every day | ORAL | Status: DC
  Administered 2018-12-10: 75 mg via ORAL
  Filled 2018-12-10: qty 1

## 2018-12-10 NOTE — Discharge Summary (Signed)
[facility] [place] [facility]   PATIENT NAME: Gabriella Hawkins    MR#:  [id number]  DATE OF BIRTH:  1920/01/06  DATE OF ADMISSION:  12/07/2018 ADMITTING PHYSICIAN: Hannah Beat, MD  DATE OF DISCHARGE: 12/10/2018  PRIMARY CARE PHYSICIAN: Danella Penton, MD    ADMISSION DIAGNOSIS:  Dehydration [E86.0] Hyponatremia [E87.1] AKI (acute kidney injury) (HCC) [N17.9] Acute kidney failure (HCC) [N17.9] Altered mental status, unspecified altered mental status type [R41.82]  DISCHARGE DIAGNOSIS:  Altered mental status--improving Acute renal failure due to poor po intake Adult failure to thrive  SECONDARY DIAGNOSIS:   Past Medical History:  Diagnosis Date  . CAD (coronary artery disease)   . Carotid atherosclerosis   . CVA (cerebral vascular accident) (HCC)   . Hypertension     HOSPITAL COURSE:   *Acute metabolic encephalopathy-possibly due to dementia and acute kidney failure/dehydration Clinically improving Aspiration and falls precautions. Pt's creatinine much improved patient is alert and oriented to place city name  *Acute on chronic kidney failure- recieved Gentle hydration with IV fluids   Avoid nephrotoxins  *Acute hyponatremia-possibly due tokidney failure/renal failure to thrive with poor PO intakeSodium is better [phone] - patient to eat.  *Acute hypokalemia-improving  A*ltered mental status than her baseline-probably from dementia Psych consult placed, notification sent to psychiatry Dr. Reita May -- no recommendations given. Continue to monitor. Patient doesn't have underlying psych issues in the past.  Spoke at length with patient's nephew Mr. Jamesetta So and patient currently is not hospice appropriate. Nephew understands. Patient will discharged back to [facility] independent facility with sister at home and palliative care can follow at [facility].  Patient is a DNR. CONSULTS OBTAINED:  Treatment Team:  Reggie Pile,  MD  DRUG ALLERGIES:   Allergies  Allergen Reactions  . Phenobarbital     Doesn't remember  . Tramadol     Other reaction(s): Unknown  . Lisinopril Other (See Comments)    dizzy    DISCHARGE MEDICATIONS:   Allergies as of 12/10/2018      Reactions   Phenobarbital    Doesn't remember   Tramadol    Other reaction(s): Unknown   Lisinopril Other (See Comments)   dizzy      Medication List    STOP taking these medications   hydrochlorothiazide 12.5 MG capsule Commonly known as:  MICROZIDE   isosorbide mononitrate 30 MG 24 hr tablet Commonly known as:  IMDUR     TAKE these medications   acetaminophen 325 MG tablet Commonly known as:  TYLENOL Take 650 mg by mouth every 4 (four) hours as needed.   aspirin EC 81 MG tablet Take 81 mg by mouth daily.   atorvastatin 20 MG tablet Commonly known as:  LIPITOR Take 1 tablet (20 mg total) by mouth daily at 6 PM.   clopidogrel 75 MG tablet Commonly known as:  PLAVIX Take 1 tablet (75 mg total) by mouth daily.   gabapentin 100 MG capsule Commonly known as:  NEURONTIN Take 100 mg by mouth at bedtime.   levothyroxine 75 MCG tablet Commonly known as:  SYNTHROID Take 75 mcg by mouth every morning.   metoprolol tartrate 25 MG tablet Commonly known as:  LOPRESSOR Take 0.5 tablets (12.5 mg total) by mouth 2 (two) times daily.   vitamin B-12 1000 MCG tablet Commonly known as:  CYANOCOBALAMIN Take 1,000 mcg by mouth daily.   Vitamin D-1000 Max St 25 MCG (1000 UT) tablet Generic drug:  Cholecalciferol Take 1,000 Units  by mouth daily.       If you experience worsening of your admission symptoms, develop shortness of breath, life threatening emergency, suicidal or homicidal thoughts you must seek medical attention immediately by calling 911 or calling your MD immediately  if symptoms less severe.  You Must read complete instructions/literature along with all the possible adverse reactions/side effects for all the Medicines  you take and that have been prescribed to you. Take any new Medicines after you have completely understood and accept all the possible adverse reactions/side effects.   Please note  You were cared for by a hospitalist during your hospital stay. If you have any questions about your discharge medications or the care you received while you were in the hospital after you are discharged, you can call the unit and asked to speak with the hospitalist on call if the hospitalist that took care of you is not available. Once you are discharged, your primary care physician will handle any further medical issues. Please note that NO REFILLS for any discharge medications will be authorized once you are discharged, as it is imperative that you return to your primary care physician (or establish a relationship with a primary care physician if you do not have one) for your aftercare needs so that they can reassess your need for medications and monitor your lab values. Today   SUBJECTIVE   Awake and alert. Drinking some. Does not want to eat today  VITAL SIGNS:  Blood pressure 121/71, pulse 85, temperature 99.2 F (37.3 C), temperature source Oral, resp. rate 19, height 5\' 2"  (1.575 m), weight 62.7 kg, SpO2 96 %.  I/O:    Intake/Output Summary (Last 24 hours) at 12/10/2018 1352 Last data filed at 12/10/2018 1331 Gross per 24 hour  Intake 180 ml  Output 350 ml  Net -170 ml    PHYSICAL EXAMINATION:  GENERAL:  85 y.o.-year-old patient lying in the bed with no acute distress. thin EYES: Pupils equal, round, reactive to light and accommodation. No scleral icterus. Extraocular muscles intact.  HEENT: Head atraumatic, normocephalic. Oropharynx and nasopharynx clear.oral mucosa dry  NECK:  Supple, no jugular venous distention. No thyroid enlargement, no tenderness.  LUNGS: Normal breath sounds bilaterally, no wheezing, rales,rhonchi or crepitation. No use of accessory muscles of respiration.  CARDIOVASCULAR: S1,  S2 normal. No murmurs, rubs, or gallops.  ABDOMEN: Soft, non-tender, non-distended. Bowel sounds present. No organomegaly or mass.  EXTREMITIES: No pedal edema, cyanosis, or clubbing.  NEUROLOGIC: moves all extremities well PSYCHIATRIC: The patient is alert and oriented x 2.  SKIN: No obvious rash, lesion, or ulcer.   DATA REVIEW:   CBC  Recent Labs  Lab 12/07/18 2256  WBC 11.9*  HGB 13.3  HCT 39.0  PLT 234    Chemistries  Recent Labs  Lab 12/07/18 2256  12/09/18 0431  NA 129*   < > 133*  K 3.2*   < > 3.4*  CL 91*   < > 96*  CO2 22   < > 26  GLUCOSE 183*   < > 106*  BUN 57*   < > 23  CREATININE 2.58*   < > 1.13*  CALCIUM 9.1   < > 8.5*  AST 44*  --   --   ALT 38  --   --   ALKPHOS 76  --   --   BILITOT 1.0  --   --    < > = values in this interval not displayed.  Microbiology Results   Recent Results (from the past 240 hour(s))  Urine culture     Status: None   Collection Time: 12/07/18 11:51 PM  Result Value Ref Range Status   Specimen Description   Final    URINE, CATHETERIZED Performed at [facility], [address], [place], [place] [address]    Special Requests   Final    NONE Performed at [facility], [address], [place], [place] [address]    Culture   Final    NO GROWTH Performed at [facility], [address], [place], [place] [address]    Report Status 12/09/2018 FINAL  Final  MRSA PCR Screening     Status: None   Collection Time: 12/09/18  4:23 AM  Result Value Ref Range Status   MRSA by PCR NEGATIVE NEGATIVE Final    Comment:        The GeneXpert MRSA Assay (FDA approved for NASAL specimens only), is one component of a comprehensive MRSA colonization surveillance program. It is not intended to diagnose MRSA infection nor to guide or monitor treatment for MRSA infections. Performed at [facility], [address], [place], [place] [address]     RADIOLOGY:  No results found.   CODE  STATUS:     Code Status Orders  (From admission, onward)         Start     Ordered   12/08/18 0405  Do not attempt resuscitation (DNR)  Continuous    Question Answer Comment  In the event of cardiac or respiratory ARREST Do not call a "code blue"   In the event of cardiac or respiratory ARREST Do not perform Intubation, CPR, defibrillation or ACLS   In the event of cardiac or respiratory ARREST Use medication by any route, position, wound care, and other measures to relive pain and suffering. May use oxygen, suction and manual treatment of airway obstruction as needed for comfort.      12/08/18 0406        Code Status History    Date Active Date Inactive Code Status Order ID Comments User Context   12/08/2018 0312 12/08/2018 0406 DNR [id number]  Raliegh Scarlet, NP ED   12/08/2018 [phone] 12/08/2018 0312 DNR [id number]  Raliegh Scarlet, NP ED   05/17/2017 1884 05/18/2017 1809 DNR [id number]  Ihor Austin, MD ED      TOTAL TIME TAKING CARE OF THIS PATIENT: *40* minutes.    Enedina Finner M.D on 12/10/2018 at 1:52 PM  Between 7am to 6pm - Pager - [phone] After 6pm go to [email or web address] - Social research officer, government  [facility]  Office  [phone]  CC: Primary care physician; Danella Penton, MD

## 2018-12-10 NOTE — Evaluation (Addendum)
Physical Therapy Evaluation Patient Details Name: Gabriella Hawkins MRN: [id number] DOB: Sep 20, 1919 Today's Date: 12/10/2018   History of Present Illness  85 y.o. female with a known history of possible dementia, CAD, CVA presents the ED with complaints of altered mental status from her assisted living facility 12/07/18.  Clinical Impression  Patient in bed resting on PT arrival and responds "I suppose that'd be alright" when asked about participation in therapy. Patient c/o increased pain with any movement occurring superior to the knees. Patient demonstrates some mild deficits in strength of BLE when assessed in supine. Patient reports having a "regular bed" at [facility], but also states that she does not have to get up from a flat bed; denies having bed rails. Patient unable to perform bed mobility without assistance with HOB elevated. Increased pain with attempts at bed mobility led to the patient discontinuing the session. Patient responded poorly to max encouragement. When PT suggested follow-up with HHPT to address deficits in strength and mobility, patient denied any interest and reported "they have all the services I need at [facility]."   Patient left in bed with bed alarm set, call bell in reach; all needs met.     Follow Up Recommendations Supervision/Assistance - 24 hour;Supervision for mobility/OOB;Home health PT    Equipment Recommendations  Standard walker    Recommendations for Other Services       Precautions / Restrictions Precautions Precautions: Fall Restrictions Weight Bearing Restrictions: No      Mobility  Bed Mobility Overal bed mobility: Needs Assistance             General bed mobility comments: HOB elevated 2/2 to pain; when PT attempted to lower [facility] to assess (bed at [facility] is "regular bed" per patient), patient reported increased pain and discontinued session.  Transfers Overall transfer level: Needs assistance                General transfer comment: HOB elevated 2/2 to pain; when PT attempted to lower [facility] to assess (bed at [facility] is "regular bed" per patient), patient reported increased pain and discontinued session.  Ambulation/Gait             General Gait Details: Unable to assess 2/2 to patient discontinuing session due to pain with bed mobility.  Stairs            Wheelchair Mobility    Modified Rankin (Stroke Patients Only)       Balance Overall balance assessment: (Unable to assess 2/2 to patient discontinuing session due to pain with bed mobility.)                                           Pertinent Vitals/Pain      Home Living Family/patient expects to be discharged to:: Hospice/Palliative care               Home Equipment: Kasandra Knudsen - single point Additional Comments: Per chart: patient is to be discharged to [facility] with palliative and home health services.    Prior Function Level of Independence: Independent with assistive device(s)   Gait / Transfers Assistance Needed: SPC used and per patient and no difficulties in [place]        Extremity/Trunk Assessment   Upper Extremity Assessment Upper Extremity Assessment: Generalized  weakness    Lower Extremity Assessment Lower Extremity Assessment: Generalized weakness       Communication   Communication: No difficulties  Cognition Arousal/Alertness: Lethargic Behavior During Therapy: WFL for tasks assessed/performed;Flat affect Overall Cognitive Status: No family/caregiver present to determine baseline cognitive functioning                                 General Comments: Per chart: patient has dementia at baseline and was admitted 2/2 to altered mental status.      General Comments      Exercises     Assessment/Plan    PT Assessment Patient needs continued PT services  PT Problem List Decreased  strength;Pain;Decreased cognition;Decreased range of motion;Decreased activity tolerance;Decreased balance;Decreased mobility       PT Treatment Interventions Therapeutic exercise;Balance training;Gait training;Stair training;Neuromuscular re-education;Functional mobility training;Therapeutic activities;Patient/family education;DME instruction    PT Goals (Current goals can be found in the Care Plan section)  Acute Rehab PT Goals Patient Stated Goal: go back to [facility] PT Goal Formulation: With patient Time For Goal Achievement: 12/24/18 Potential to Achieve Goals: Good    Frequency Min 2X/week   Barriers to discharge        Co-evaluation               AM-PAC PT "6 Clicks" Mobility  Outcome Measure Help needed turning from your back to your side while in a flat bed without using bedrails?: A Lot Help needed moving from lying on your back to sitting on the side of a flat bed without using bedrails?: A Lot Help needed moving to and from a bed to a chair (including a wheelchair)?: A Little Help needed standing up from a chair using your arms (e.g., wheelchair or bedside chair)?: A Little Help needed to walk in hospital room?: A Little Help needed climbing 3-5 steps with a railing? : A Lot 6 Click Score: 15    End of Session         PT Visit Diagnosis: Difficulty in walking, not elsewhere classified (R26.2);Muscle weakness (generalized) (M62.81);Pain    Time: 0157-0211 PT Time Calculation (min) (ACUTE ONLY): 14 min   Charges:   PT Evaluation $PT Eval Low Complexity: 1 Low         Myles Gip PT, DPT [phone] 12/10/2018, 2:38 PM

## 2018-12-10 NOTE — Consult Note (Signed)
[facility] Face-to-Face Psychiatry Consult   Reason for Consult:  Dementia Referring Physician:   Dr. Amado Coe Patient Identification: Gabriella Hawkins MRN:  [id number] Principal Diagnosis:Acute encephalopathy, resolving  Diagnosis:   Acute encephalopathy, resolving  Total Time spent with patient: 15 minutes   HPI:   Gabriella Hawkins is a 85 year old Caucasian female with no known psychiatric history who presented to the hospital due to altered mental status.  She lives at [facility], an independent living facility.  Her nephew is her power of attorney and I was able to speak to him today.  According to him, she had been doing well emotionally and physically up until 4 weeks ago when she had a urinary tract infection, and consequently developed some confusion as well as auditory hallucinations.  She was treated with ciprofloxacin which helped a little and then was prescribed another antibiotic but she remained somewhat confused.  Last Friday, she was in the parking lot of the facility that she lives in, and was awaiting a family member to pick her up.  There was no such plans for her to discharge from there.  She has no history of overt psychosis but was noted to have auditory hallucinations for a few days when she had a urinary tract infection 4 years ago; it resolved once treated appropriately.  The family is worried that she may not be able to function at an independent living facility while she has altered mental status.  Note no history of dementia or major neurocognitive disorder.  Today, patient is alert orientated x2.  She is fatigued but arouses easily and converses well.  She continues to deny suicidal or homicidal ideation.  She does not endorse any new hallucinations.  Patient reports adequate sleep and good appetite.  She reports that she is continuing to feel better.  Per initial intake: Denies having stressors and reports that her prevailing mood has been happy.  She had sustained 2  falls last week but hit her elbow and not her head.  She no longer feels confused.  She does recall having auditory hallucinations a few weeks ago. No symptoms of mania, OCD, anxiety, or PTSD.  She denies any history of self-harm or suicidal ideations.  No bouts of aggression or poor temperament in the past.   No history of psychiatric admissions.  No psychiatric history.  No medication trials.   Past Psychiatric History:  No history of psychiatric admissions.  No psychiatric history.  No medication trials.  Past Medical History:  Past Medical History:  Diagnosis Date  . CAD (coronary artery disease)   . Carotid atherosclerosis   . CVA (cerebral vascular accident) (HCC)   . Hypertension     Past Surgical History:  Procedure Laterality Date  . APPENDECTOMY    . CORONARY ARTERY BYPASS GRAFT    . RETINAL DETACHMENT SURGERY    . THYROID SURGERY     Family History:  Family History  Problem Relation Age of Onset  . Hypertension Neg Hx   . Diabetes Mellitus II Neg Hx    Family Psychiatric  History: HX of dementia in a sister  Social History:   Social History   Substance and Sexual Activity  Alcohol Use No     Social History   Substance and Sexual Activity  Drug Use No    Social History   Socioeconomic History  . Marital status: Widowed    Spouse name: Not on file  . Number of children: Not on file  . Years  of education: Not on file  . Highest education level: Not on file  Occupational History  . Occupation: retired  Engineer, production  . Financial resource strain: Not on file  . Food insecurity:    Worry: Not on file    Inability: Not on file  . Transportation needs:    Medical: Not on file    Non-medical: Not on file  Tobacco Use  . Smoking status: Never Smoker  . Smokeless tobacco: Never Used  Substance and Sexual Activity  . Alcohol use: No  . Drug use: No  . Sexual activity: Never  Lifestyle  . Physical activity:    Days per week: Not on file    Minutes  per session: Not on file  . Stress: Not on file  Relationships  . Social connections:    Talks on phone: Not on file    Gets together: Not on file    Attends religious service: Not on file    Active member of club or organization: Not on file    Attends meetings of clubs or organizations: Not on file    Relationship status: Not on file  Other Topics Concern  . Not on file  Social History Narrative  . Not on file   Additional Social History:    She is widowed, she is 28 and her husband passed away 12 years ago.  Indicates "I have lived a good life." She never had kids.  She is not working at this time.  Nephew is her power of attorney. Twins sister pasted away a few years ago. She had dementia.   Allergies:   Allergies  Allergen Reactions  . Phenobarbital     Doesn't remember  . Tramadol     Other reaction(s): Unknown  . Lisinopril Other (See Comments)    dizzy    Labs:  Results for orders placed or performed during the hospital encounter of 12/07/18 (from the past 48 hour(s))  MRSA PCR Screening     Status: None   Collection Time: 12/09/18  4:23 AM  Result Value Ref Range   MRSA by PCR NEGATIVE NEGATIVE    Comment:        The GeneXpert MRSA Assay (FDA approved for NASAL specimens only), is one component of a comprehensive MRSA colonization surveillance program. It is not intended to diagnose MRSA infection nor to guide or monitor treatment for MRSA infections. Performed at [facility], [address] [place], [place] [address]   Basic metabolic panel     Status: Abnormal   Collection Time: 12/09/18  4:31 AM  Result Value Ref Range   Sodium 133 (L) 135 - 145 mmol/L   Potassium 3.4 (L) 3.5 - 5.1 mmol/L   Chloride 96 (L) 98 - 111 mmol/L   CO2 26 22 - 32 mmol/L   Glucose, Bld 106 (H) 70 - 99 mg/dL   BUN 23 8 - 23 mg/dL   Creatinine, Ser 6.04 (H) 0.44 - 1.00 mg/dL   Calcium 8.5 (L) 8.9 - 10.3 mg/dL   GFR calc non Af Amer 41 (L) >60 mL/min   GFR  calc Af Amer 48 (L) >60 mL/min   Anion gap 11 5 - 15    Comment: Performed at [facility], [address], [place], [place] [address]    Current Facility-Administered Medications  Medication Dose Route Frequency Provider Last Rate Last Dose  . acetaminophen (TYLENOL) tablet 650 mg  650 mg Oral Q6H PRN Raliegh Scarlet, NP  650 mg at 12/10/18 1016   Or  . acetaminophen (TYLENOL) suppository 650 mg  650 mg Rectal Q6H PRN Raliegh ScarletSidney, Katrina, NP      . alum & mag hydroxide-simeth (MAALOX/MYLANTA) 200-200-20 MG/5ML suspension 30 mL  30 mL Oral Q6H PRN Raliegh ScarletSidney, Katrina, NP      . antiseptic oral rinse (BIOTENE) solution 15 mL  15 mL Topical PRN Raliegh ScarletSidney, Katrina, NP      . aspirin EC tablet 81 mg  81 mg Oral Daily Luther ParodySidney, Katrina, NP   81 mg at 12/10/18 1016  . atropine 1 % ophthalmic solution 4 drop  4 drop Sublingual Q4H PRN Raliegh ScarletSidney, Katrina, NP      . baclofen (LIORESAL) tablet 5 mg  5 mg Oral Q12H PRN Raliegh ScarletSidney, Katrina, NP      . bisacodyl (DULCOLAX) suppository 10 mg  10 mg Rectal Daily PRN Raliegh ScarletSidney, Katrina, NP      . diphenhydrAMINE (BENADRYL) injection 12.5 mg  12.5 mg Intravenous Q4H PRN Raliegh ScarletSidney, Katrina, NP      . haloperidol (HALDOL) tablet 0.5 mg  0.5 mg Oral Q4H PRN Mansy, Jan A, MD       Or  . haloperidol (HALDOL) 2 MG/ML solution 0.5 mg  0.5 mg Sublingual Q4H PRN Mansy, Jan A, MD      . haloperidol lactate (HALDOL) injection 1 mg  1 mg Intramuscular Q6H PRN Mansy, Jan A, MD      . loperamide (IMODIUM) capsule 2 mg  2 mg Oral Q2H PRN Raliegh ScarletSidney, Katrina, NP      . magic mouthwash  15 mL Oral Q6H PRN Raliegh ScarletSidney, Katrina, NP      . morphine 2 MG/ML injection 1 mg  1 mg Intravenous Q2H PRN Raliegh ScarletSidney, Katrina, NP   1 mg at 12/09/18 0430  . morphine CONCENTRATE 10 MG/0.5ML oral solution 5 mg  5 mg Oral Q2H PRN Raliegh ScarletSidney, Katrina, NP       Or  . morphine CONCENTRATE 10 MG/0.5ML oral solution 5 mg  5 mg Sublingual Q2H PRN Raliegh ScarletSidney, Katrina, NP      . ondansetron (ZOFRAN) tablet 4 mg  4 mg Oral Q6H PRN  Raliegh ScarletSidney, Katrina, NP       Or  . ondansetron (ZOFRAN) injection 4 mg  4 mg Intravenous Q6H PRN Raliegh ScarletSidney, Katrina, NP      . polyvinyl alcohol (LIQUIFILM TEARS) 1.4 % ophthalmic solution 1 drop  1 drop Both Eyes QID PRN Raliegh ScarletSidney, Katrina, NP      . senna-docusate (Senokot-S) tablet 1 tablet  1 tablet Oral QHS PRN Raliegh ScarletSidney, Katrina, NP      . sodium chloride flush (NS) 0.9 % injection 3 mL  3 mL Intravenous Once Irean HongSung, Jade J, MD      . traZODone (DESYREL) tablet 25 mg  25 mg Oral QHS PRN Raliegh ScarletSidney, Katrina, NP        Musculoskeletal: Strength & Muscle Tone: within normal limits Gait & Station: normal Patient leans: Front  Psychiatric Specialty Exam: Physical Exam  Nursing note and vitals reviewed. Constitutional: She appears well-developed and well-nourished. No distress.  HENT:  Head: Normocephalic and atraumatic.  Eyes: EOM are normal.  Neck: Normal range of motion.  Cardiovascular: Normal rate and regular rhythm.  Respiratory: Effort normal. No respiratory distress.  Musculoskeletal: Normal range of motion.  Neurological: She is alert.    Review of Systems  Psychiatric/Behavioral: Negative for depression, hallucinations, substance abuse and suicidal ideas. The patient is not nervous/anxious.   All other systems reviewed  and are negative.   Blood pressure 121/71, pulse 85, temperature 99.2 F (37.3 C), temperature source Oral, resp. rate 19, height 5\' 2"  (1.575 m), weight 62.7 kg, SpO2 96 %.Body mass index is 25.3 kg/m.  General Appearance: Casual  Eye Contact:  Good  Speech:  Normal Rate  Volume:  Normal  Mood:  Negative  Affect:  Full Range  Thought Process:  Coherent  Orientation:  Full (Time, Place, and Person)  Thought Content:  Logical  Suicidal Thoughts:  No  Homicidal Thoughts:  No  Memory:  Immediate;   Fair  Judgement:  Fair  Insight:  Fair  Psychomotor Activity:  Normal  Concentration:  Concentration: Good  Recall:  Fair  Fund of Knowledge:  Fair  Language:  Good   Akathisia:  No  Handed:  Right  AIMS (if indicated):     Assets:  Communication Skills  ADL's:  Intact  Cognition:  Mild age related impairment.   Sleep:   Adequate     Treatment Plan Summary: Daily contact with patient to assess and evaluate symptoms and progress in treatment Patient does not meet inpatient psychiatric criteria It seems as though the acute encephalopathy is slowly resolving Collateral information obtained from the nephew Patient does not require any psychiatric medications at this time.  Disposition: No evidence of imminent risk to self or others at present.    Mariel Craft, MD 12/10/2018 10:25 AM

## 2018-12-10 NOTE — Discharge Instructions (Signed)
Acute Kidney Injury, Adult ° °Acute kidney injury is a sudden worsening of kidney function. The kidneys are organs that have several jobs. They filter the blood to remove waste products and extra fluid. They also maintain a healthy balance of minerals and hormones in the body, which helps control blood pressure and keep bones strong. With this condition, your kidneys do not do their jobs as well as they should. °This condition ranges from mild to severe. Over time it may develop into long-lasting (chronic) kidney disease. Early detection and treatment may prevent acute kidney injury from developing into a chronic condition. °What are the causes? °Common causes of this condition include: °· A problem with blood flow to the kidneys. This may be caused by: °? Low blood pressure (hypotension) or shock. °? Blood loss. °? Heart and blood vessel (cardiovascular) disease. °? Severe burns. °? Liver disease. °· Direct damage to the kidneys. This may be caused by: °? Certain medicines. °? A kidney infection. °? Poisoning. °? Being around or in contact with toxic substances. °? A surgical wound. °? A hard, direct hit to the kidney area. °· A sudden blockage of urine flow. This may be caused by: °? Cancer. °? Kidney stones. °? An enlarged prostate in males. °What are the signs or symptoms? °Symptoms of this condition may not be obvious until the condition becomes severe. Symptoms of this condition can include: °· Tiredness (lethargy), or difficulty staying awake. °· Nausea or vomiting. °· Swelling (edema) of the face, legs, ankles, or feet. °· Problems with urination, such as: °? Abdominal pain, or pain along the side of your stomach (flank). °? Decreased urine production. °? Decrease in the force of urine flow. °· Muscle twitches and cramps, especially in the legs. °· Confusion or trouble concentrating. °· Loss of appetite. °· Fever. °How is this diagnosed? °This condition may be diagnosed with tests, including: °· Blood  tests. °· Urine tests. °· Imaging tests. °· A test in which a sample of tissue is removed from the kidneys to be examined under a microscope (kidney biopsy). °How is this treated? °Treatment for this condition depends on the cause and how severe the condition is. In mild cases, treatment may not be needed. The kidneys may heal on their own. In more severe cases, treatment will involve: °· Treating the cause of the kidney injury. This may involve changing any medicines you are taking or adjusting your dosage. °· Fluids. You may need specialized IV fluids to balance your body's needs. °· Having a catheter placed to drain urine and prevent blockages. °· Preventing problems from occurring. This may mean avoiding certain medicines or procedures that can cause further injury to the kidneys. °In some cases treatment may also require: °· A procedure to remove toxic wastes from the body (dialysis or continuous renal replacement therapy - CRRT). °· Surgery. This may be done to repair a torn kidney, or to remove the blockage from the urinary system. °Follow these instructions at home: °Medicines °· Take over-the-counter and prescription medicines only as told by your health care provider. °· Do not take any new medicines without your health care provider's approval. Many medicines can worsen your kidney damage. °· Do not take any vitamin and mineral supplements without your health care provider's approval. Many nutritional supplements can worsen your kidney damage. °Lifestyle °· If your health care provider prescribed changes to your diet, follow them. You may need to decrease the amount of protein you eat. °· Achieve and maintain a healthy   weight. If you need help with this, ask your health care provider. °· Start or continue an exercise plan. Try to exercise at least 30 minutes a day, 5 days a week. °· Do not use any tobacco products, such as cigarettes, chewing tobacco, and e-cigarettes. If you need help quitting, ask your  health care provider. °General instructions °· Keep track of your blood pressure. Report changes in your blood pressure as told by your health care provider. °· Stay up to date with immunizations. Ask your health care provider which immunizations you need. °· Keep all follow-up visits as told by your health care provider. This is important. °Where to find more information °· American Association of Kidney Patients: www.aakp.org °· National Kidney Foundation: www.kidney.org °· American Kidney Fund: www.akfinc.org °· [employer] Rehabilitation Program: °? www.lifeoptions.org °? www.kidneyschool.org °Contact a health care provider if: °· Your symptoms get worse. °· You develop new symptoms. °Get help right away if: °· You develop symptoms of worsening kidney disease, which include: °? Headaches. °? Abnormally dark or light skin. °? Easy bruising. °? Frequent hiccups. °? Chest pain. °? Shortness of breath. °? End of menstruation in women. °? Seizures. °? Confusion or altered mental status. °? Abdominal or back pain. °? Itchiness. °· You have a fever. °· Your body is producing less urine. °· You have pain or bleeding when you urinate. °Summary °· Acute kidney injury is a sudden worsening of kidney function. °· Acute kidney injury can be caused by problems with blood flow to the kidneys, direct damage to the kidneys, and sudden blockage of urine flow. °· Symptoms of this condition may not be obvious until it becomes severe. Symptoms may include edema, lethargy, confusion, nausea or vomiting, and problems passing urine. °· This condition can usually be diagnosed with blood tests, urine tests, and imaging tests. Sometimes a kidney biopsy is done to diagnose this condition. °· Treatment for this condition often involves treating the underlying cause. It is treated with fluids, medicines, dialysis, diet changes, or surgery. °This information is not intended to replace advice given to you by your health care provider. Make  sure you discuss any questions you have with your health care provider. °Document Released: 02/14/2011 Document Revised: 12/01/2016 Document Reviewed: 07/22/2016 °[employer]vier Interactive Patient Education © 2019 [employer] ° °

## 2018-12-10 NOTE — Consult Note (Addendum)
Consultation Note Date: 12/10/2018   Patient Name: Gabriella Hawkins  DOB: 09/06/1919  MRN: [id number]  Age / Sex: 85 y.o., female  PCP: Danella PentonMiller, Mark F, MD Referring Physician: Enedina FinnerPatel, Sona, MD  Reason for Consultation: Establishing goals of care and Psychosocial/spiritual support  HPI/Patient Profile: 85 y.o. female  with past medical history of CAD with CABG, carotid atherosclerosis, hypertension, CVA, admitted on 12/07/2018 with acute metabolic encephalopathy possibly due to dementia or kidney failure.   Clinical Assessment and Goals of Care: Gabriella Hawkins is lying quietly in bed.  She will briefly make but not keep eye contact.  She is calm and cooperative, not fearful.  She appears chronically ill and frail.  There is no family at bedside at this time due to visitor restrictions.  Asked how she is feeling, and Gabriella Hawkins tells me that she is having pain where she failed and is bruised.  Ask her to show me where you are, but she points to her perineum.  I ask orientation questions and she tells me that she is at her uncle's house.  I share that she is safe, to which she responds, "I do not feel safe anywhere anymore".  She tells me she is having pending, but declines any pain medication instead stating, "just leave me alone".  Call to nephew/HC POA, Charise Carwinhilip Griffin at [phone].  Loistine Chancehilip shares that Gabriella Hawkins has been at the independent living facility for 3 years.  He shares that she has been "talking out of her head" the last few months.   Loistine Chancehilip shares that his mother, Gabriella Hawkins's twin sister, their mother, and their grandmother, all had dementia.  Loistine Chancehilip shares that his mother passed away with dementia a few years ago.          We talked about hospice type care.  Loistine Chancehilip states that the family is looking for hospice type care for Gabriella Hawkins stating that she will be 85 years old soon.  He  shares a story of decline over the last few years, frailty.  MOST form discussed.  Aneta Minshillip and his brothers endorse DO NOT ReHOSPITALIZE, when Gabriella Hawkins gets sick again, let nature take it's course.  We talked about reaching out to palliative provider with a goal to transition to hospice care.  We discuss dehydration, share that Gabriella Hawkins is likely to return to her home environment and not eat or drink enough.  I anticipate she will continue to become dehydrated.   I believe that she will easily transition to Hospice care. Choice offered, Loistine ChancePhilip elects [facility].  I encouraged to reach out to palliative/hospice provider when Gabriella Hawkins becomes ill again.  States that he understands Gabriella Hawkins does not qualify for hospice care at this point.  I shared that is correct, and suggest palliative services to start.  Loistine Chancehilip agrees.  We talked about medications such as those for cholesterol.  Loistine Chancehilip shares that Gabriella Hawkins has not taken her medication all week Aneta Mins(Phillip fills her pillbox), and tells  me he is not worried about her taking any further medications.  We also discuss IV fluids.  Aneta Mins agrees to no IV fluids.  I share that we can prolong life, but also prolong the dying process and thereby suffering.   HC POA NEXT OF KIN -nephew, Charise Carwin, tells me he shares healthcare power of attorney with his brothers.  All family is agreeable to comfort care.  SUMMARY OF RECOMMENDATIONS   Return to [facility]   Code Status/Advance Care Planning:  DNR  Symptom Management:   Per hospitalist, no additional needs at this time.  Palliative Prophylaxis:   Frequent Pain Assessment, Oral Care and Turn Reposition  Additional Recommendations (Limitations, Scope, Preferences):  Full Comfort Care  Psycho-social/Spiritual:   Desire for further Chaplaincy support:no  Additional Recommendations: Caregiving  Support/Resources and Education on Hospice  Prognosis:    < 3 months, or less would not be surprising based on poor functional status, frailty, decreased appetite and by mouth intake.  Patient and family's desire to focus on comfort.  Discharge Planning: Parent to independent living facility with the benefits of  palliative care, do not rehospitalize, transition to full hospice care when possible      Primary Diagnoses: Present on Admission: . Acute kidney failure (HCC)   I have reviewed the medical record, interviewed the patient and family, and examined the patient. The following aspects are pertinent.  Past Medical History:  Diagnosis Date  . CAD (coronary artery disease)   . Carotid atherosclerosis   . CVA (cerebral vascular accident) (HCC)   . Hypertension    Social History   Socioeconomic History  . Marital status: Widowed    Spouse name: Not on file  . Number of children: Not on file  . Years of education: Not on file  . Highest education level: Not on file  Occupational History  . Occupation: retired  Engineer, production  . Financial resource strain: Not on file  . Food insecurity:    Worry: Not on file    Inability: Not on file  . Transportation needs:    Medical: Not on file    Non-medical: Not on file  Tobacco Use  . Smoking status: Never Smoker  . Smokeless tobacco: Never Used  Substance and Sexual Activity  . Alcohol use: No  . Drug use: No  . Sexual activity: Never  Lifestyle  . Physical activity:    Days per week: Not on file    Minutes per session: Not on file  . Stress: Not on file  Relationships  . Social connections:    Talks on phone: Not on file    Gets together: Not on file    Attends religious service: Not on file    Active member of club or organization: Not on file    Attends meetings of clubs or organizations: Not on file    Relationship status: Not on file  Other Topics Concern  . Not on file  Social History Narrative  . Not on file   Family History  Problem Relation Age of Onset   . Hypertension Neg Hx   . Diabetes Mellitus II Neg Hx    Scheduled Meds: . aspirin EC  81 mg Oral Daily  . atorvastatin  20 mg Oral q1800  . cholecalciferol  1,000 Units Oral Daily  . clopidogrel  75 mg Oral Daily  . gabapentin  100 mg Oral QHS  . [START ON 12/11/2018] levothyroxine  75 mcg Oral BH-q7a  . polyethylene  glycol  17 g Oral Daily  . sodium chloride flush  3 mL Intravenous Once  . vitamin B-12  1,000 mcg Oral Daily   Continuous Infusions: PRN Meds:.acetaminophen **OR** acetaminophen, alum & mag hydroxide-simeth, antiseptic oral rinse, atropine, baclofen, bisacodyl, diphenhydrAMINE, loperamide, magic mouthwash, ondansetron **OR** ondansetron (ZOFRAN) IV, polyvinyl alcohol, senna-docusate Medications Prior to Admission:  Prior to Admission medications   Medication Sig Start Date End Date Taking? Authorizing Provider  acetaminophen (TYLENOL) 325 MG tablet Take 650 mg by mouth every 4 (four) hours as needed.   Yes [provider]  aspirin EC 81 MG tablet Take 81 mg by mouth daily.   Yes [provider]  atorvastatin (LIPITOR) 20 MG tablet Take 1 tablet (20 mg total) by mouth daily at 6 PM. 05/18/17  Yes Katha Hamming, MD  Cholecalciferol (VITAMIN D-1000 MAX ST) 1000 units tablet Take 1,000 Units by mouth daily. 10/29/14  Yes [provider]  clopidogrel (PLAVIX) 75 MG tablet Take 1 tablet (75 mg total) by mouth daily. 05/19/17  Yes Katha Hamming, MD  gabapentin (NEURONTIN) 100 MG capsule Take 100 mg by mouth at bedtime. 04/18/17  Yes [provider]  hydrochlorothiazide (MICROZIDE) 12.5 MG capsule Take 12.5 mg by mouth daily.  04/13/17  Yes [provider]  levothyroxine (SYNTHROID, LEVOTHROID) 75 MCG tablet Take 75 mcg by mouth every morning. 07/13/15  Yes [provider]  metoprolol tartrate (LOPRESSOR) 25 MG tablet Take 0.5 tablets (12.5 mg total) by mouth 2 (two) times daily. 05/18/17  Yes Katha Hamming, MD   isosorbide mononitrate (IMDUR) 30 MG 24 hr tablet Take 1 tablet (30 mg total) by mouth daily. Patient not taking: Reported on 12/08/2018 05/19/17   Katha Hamming, MD  vitamin B-12 (CYANOCOBALAMIN) 1000 MCG tablet Take 1,000 mcg by mouth daily.    [provider]   Allergies  Allergen Reactions  . Phenobarbital     Doesn't remember  . Tramadol     Other reaction(s): Unknown  . Lisinopril Other (See Comments)    dizzy   Review of Systems  Unable to perform ROS: Age  Neurological: Negative for light-headedness.    Physical Exam Vitals signs and nursing note reviewed.  Constitutional:      General: She is not in acute distress.    Appearance: She is ill-appearing.     Comments: Appears chronically ill and frail, thin.  Will make an somewhat keep eye contact  HENT:     Head: Atraumatic.  Cardiovascular:     Rate and Rhythm: Normal rate.  Pulmonary:     Effort: Pulmonary effort is normal. No respiratory distress.  Abdominal:     General: Abdomen is flat. There is no distension.     Tenderness: There is no guarding.  Musculoskeletal:        General: No swelling.     Comments: Frail and thin, muscle wasting  Skin:    General: Skin is warm and dry.  Neurological:     Mental Status: She is alert.     Comments: Oriented to self only  Psychiatric:     Comments: Calm and cooperative, not fearful     Vital Signs: BP 121/71 (BP Location: Right Arm)   Pulse 85   Temp 99.2 F (37.3 C) (Oral)   Resp 19   Ht  (1.575 m)   Wt 62.7 kg   SpO2 96%   BMI 25.30 kg/m  Pain Scale: 0-10 POSS *See Group Information*: S-Acceptable,Sleep, easy to arouse Pain  Score: Asleep   SpO2: SpO2: 96 % O2 Device:SpO2: 96 % O2 Flow Rate: .O2 Flow Rate (L/min): 2 L/min  IO: Intake/output summary:   Intake/Output Summary (Last 24 hours) at 12/10/2018 1203 Last data filed at 12/10/2018 1008 Gross per 24 hour  Intake 0 ml  Output 150 ml  Net -150 ml    LBM: Last BM Date:  12/06/18(per pt) Baseline Weight: Weight: 66.7 kg Most recent weight: Weight: 62.7 kg     Palliative Assessment/Data:   Flowsheet Rows     Most Recent Value  Intake Tab  Referral Department  Hospitalist  Unit at Time of Referral  Med/Surg Unit  Palliative Care Primary Diagnosis  Other (Comment)  Date Notified  12/09/18  Palliative Care Type  New Palliative care  Date of Admission  12/07/18  Date first seen by Palliative Care  12/10/18  # of days Palliative referral response time  1 Day(s)  # of days IP prior to Palliative referral  2  Clinical Assessment  Palliative Performance Scale Score  30%  Pain Max last 24 hours  Not able to report  Pain Min Last 24 hours  Not able to report  Dyspnea Max Last 24 Hours  Not able to report  Dyspnea Min Last 24 hours  Not able to report  Psychosocial & Spiritual Assessment  Palliative Care Outcomes      Time In: 0900 Time Out: 0950 Time Total: 50 minutes Greater than 50%  of this time was spent counseling and coordinating care related to the above assessment and plan.  Signed by: Katheran Awe, NP   Please contact Palliative Medicine Team phone at [phone] for questions and concerns.  For individual provider: See Loretha Stapler

## 2018-12-10 NOTE — Plan of Care (Signed)
Pt ready for discharge.   Problem: Education: Goal: Knowledge of General Education information will improve Description: Including pain rating scale, medication(s)/side effects and non-pharmacologic comfort measures Outcome: Completed/Met   Problem: Health Behavior/Discharge Planning: Goal: Ability to manage health-related needs will improve Outcome: Completed/Met   Problem: Clinical Measurements: Goal: Ability to maintain clinical measurements within normal limits will improve Outcome: Completed/Met Goal: Will remain free from infection Outcome: Completed/Met Goal: Diagnostic test results will improve Outcome: Completed/Met Goal: Respiratory complications will improve Outcome: Completed/Met Goal: Cardiovascular complication will be avoided Outcome: Completed/Met   Problem: Activity: Goal: Risk for activity intolerance will decrease Outcome: Completed/Met   Problem: Nutrition: Goal: Adequate nutrition will be maintained Outcome: Completed/Met   Problem: Coping: Goal: Level of anxiety will decrease Outcome: Completed/Met   Problem: Elimination: Goal: Will not experience complications related to bowel motility Outcome: Completed/Met Goal: Will not experience complications related to urinary retention Outcome: Completed/Met   Problem: Pain Managment: Goal: General experience of comfort will improve Outcome: Completed/Met   Problem: Safety: Goal: Ability to remain free from injury will improve Outcome: Completed/Met   Problem: Skin Integrity: Goal: Risk for impaired skin integrity will decrease Outcome: Completed/Met   

## 2018-12-10 NOTE — Progress Notes (Signed)
New referral for outpatient Palliative to follow at [facility] received from CSW [employer]. Plan is for discharge with home health. Patient information faxed to referral. Dayna Barker BSN, RN, [facility] Liaison [facility] [phone]

## 2018-12-10 NOTE — Progress Notes (Signed)
Pt departed for home at [facility] via non-urgent EMS transport.

## 2018-12-10 NOTE — Progress Notes (Signed)
PT Cancellation Note  Patient Details Name: Gabriella Hawkins MRN: [id number] DOB: 1919/09/14   Cancelled Treatment:    Reason Eval/Treat Not Completed: Other (comment) Chart reviewed. Patient consuming lunch on PT arrival. PT asked patient if she would be willing to participate in physical therapy after lunch; patient replied "whatever." PT will follow up at a later time/date.  Sheria Lang PT, DPT [phone] 12/10/2018, 1:19 PM

## 2018-12-10 NOTE — TOC Transition Note (Signed)
Transition of Care [facility]) - CM/SW Discharge Note   Patient Details  Name: Gabriella Hawkins MRN: [id number] Date of Birth: 15-May-1920  [facility]) CM/SW Contact:  Darleene Cleaver, LCSW Phone Number: 12/10/2018, 5:16 PM   Clinical Narrative:     Patient discharging back to [facility] with palliative, home health nurse, aide, and social work to follow.  Patient has some dementia, and confusion.  Patient's nephew is hcpoa and would like palliative to follow, and he would like patient to transition to comfort.  Catarina Hartshorn has a Community education officer with [facility] home health, which they will follow patient at [facility].  Final next level of care: Home w Hospice Care Barriers to Discharge: No Barriers Identified   Patient Goals and CMS Choice Patient states their goals for this hospitalization and ongoing recovery are:: " I want to go home, back to [facility]"  CMS Medicare.gov Compare Post Acute Care list provided to:: Patient Choice offered to / list presented to : [facility] POA / Guardian  Discharge Placement  Patient plans to return back to [facility], patient's nephew is arranging sitters for patient to use in her own home.                Name of family member notified: Carroll Sage- nephew  Patient and family notified of of transfer: 12/09/18  Discharge Plan and Services In-house Referral: Hospice / Palliative Care Discharge Planning Services: CM Consult Post Acute Care Choice: Hospice                               Social Determinants of Health (SDOH) Interventions     Readmission Risk Interventions No flowsheet data found.

## 2018-12-11 ENCOUNTER — Encounter: Payer: Self-pay | Admitting: Emergency Medicine

## 2018-12-11 ENCOUNTER — Observation Stay
Admission: EM | Admit: 2018-12-11 | Discharge: 2018-12-14 | Disposition: A | Discharge status: Skilled Nursing Facility | Payer: Medicare Other | Attending: Internal Medicine | Admitting: Internal Medicine

## 2018-12-11 ENCOUNTER — Emergency Department: Payer: Medicare Other

## 2018-12-11 ENCOUNTER — Other Ambulatory Visit: Payer: Self-pay

## 2018-12-11 ENCOUNTER — Observation Stay: Payer: Medicare Other

## 2018-12-11 DIAGNOSIS — Z8673 Personal history of transient ischemic attack (TIA), and cerebral infarction without residual deficits: Secondary | ICD-10-CM | POA: Insufficient documentation

## 2018-12-11 DIAGNOSIS — E785 Hyperlipidemia, unspecified: Secondary | ICD-10-CM | POA: Diagnosis not present

## 2018-12-11 DIAGNOSIS — R778 Other specified abnormalities of plasma proteins: Secondary | ICD-10-CM

## 2018-12-11 DIAGNOSIS — Z66 Do not resuscitate: Secondary | ICD-10-CM | POA: Diagnosis not present

## 2018-12-11 DIAGNOSIS — I248 Other forms of acute ischemic heart disease: Secondary | ICD-10-CM | POA: Insufficient documentation

## 2018-12-11 DIAGNOSIS — Z79890 Hormone replacement therapy (postmenopausal): Secondary | ICD-10-CM | POA: Insufficient documentation

## 2018-12-11 DIAGNOSIS — Z20828 Contact with and (suspected) exposure to other viral communicable diseases: Secondary | ICD-10-CM | POA: Diagnosis not present

## 2018-12-11 DIAGNOSIS — Z7902 Long term (current) use of antithrombotics/antiplatelets: Secondary | ICD-10-CM | POA: Insufficient documentation

## 2018-12-11 DIAGNOSIS — S22000A Wedge compression fracture of unspecified thoracic vertebra, initial encounter for closed fracture: Secondary | ICD-10-CM

## 2018-12-11 DIAGNOSIS — S22071A Stable burst fracture of T9-T10 vertebra, initial encounter for closed fracture: Secondary | ICD-10-CM | POA: Diagnosis not present

## 2018-12-11 DIAGNOSIS — Z951 Presence of aortocoronary bypass graft: Secondary | ICD-10-CM | POA: Insufficient documentation

## 2018-12-11 DIAGNOSIS — W19XXXA Unspecified fall, initial encounter: Secondary | ICD-10-CM | POA: Diagnosis not present

## 2018-12-11 DIAGNOSIS — R296 Repeated falls: Secondary | ICD-10-CM | POA: Diagnosis not present

## 2018-12-11 DIAGNOSIS — F0390 Unspecified dementia without behavioral disturbance: Secondary | ICD-10-CM | POA: Diagnosis not present

## 2018-12-11 DIAGNOSIS — I447 Left bundle-branch block, unspecified: Secondary | ICD-10-CM | POA: Diagnosis not present

## 2018-12-11 DIAGNOSIS — R7989 Other specified abnormal findings of blood chemistry: Secondary | ICD-10-CM

## 2018-12-11 DIAGNOSIS — I10 Essential (primary) hypertension: Secondary | ICD-10-CM | POA: Insufficient documentation

## 2018-12-11 DIAGNOSIS — Z419 Encounter for procedure for purposes other than remedying health state, unspecified: Secondary | ICD-10-CM

## 2018-12-11 DIAGNOSIS — I2510 Atherosclerotic heart disease of native coronary artery without angina pectoris: Secondary | ICD-10-CM | POA: Insufficient documentation

## 2018-12-11 DIAGNOSIS — E039 Hypothyroidism, unspecified: Secondary | ICD-10-CM | POA: Insufficient documentation

## 2018-12-11 DIAGNOSIS — E782 Mixed hyperlipidemia: Secondary | ICD-10-CM | POA: Insufficient documentation

## 2018-12-11 DIAGNOSIS — Z79899 Other long term (current) drug therapy: Secondary | ICD-10-CM | POA: Diagnosis not present

## 2018-12-11 DIAGNOSIS — M549 Dorsalgia, unspecified: Secondary | ICD-10-CM | POA: Diagnosis present

## 2018-12-11 LAB — URINALYSIS, COMPLETE (UACMP) WITH MICROSCOPIC
Bacteria, UA: NONE SEEN
Bilirubin Urine: NEGATIVE
Glucose, UA: NEGATIVE mg/dL
Ketones, ur: 20 mg/dL — AB
Leukocytes,Ua: NEGATIVE
Nitrite: NEGATIVE
Protein, ur: 30 mg/dL — AB
Specific Gravity, Urine: 1.019 (ref 1.005–1.030)
pH: 6 (ref 5.0–8.0)

## 2018-12-11 LAB — CBC WITH DIFFERENTIAL/PLATELET
Abs Immature Granulocytes: 0.07 10*3/uL (ref 0.00–0.07)
Basophils Absolute: 0 10*3/uL (ref 0.0–0.1)
Basophils Relative: 0 %
Eosinophils Absolute: 0.1 10*3/uL (ref 0.0–0.5)
Eosinophils Relative: 1 %
HCT: 43 % (ref 36.0–46.0)
Hemoglobin: 14.2 g/dL (ref 12.0–15.0)
Immature Granulocytes: 1 %
Lymphocytes Relative: 17 %
Lymphs Abs: 1.4 10*3/uL (ref 0.7–4.0)
MCH: 28.5 pg (ref 26.0–34.0)
MCHC: 33 g/dL (ref 30.0–36.0)
MCV: 86.2 fL (ref 80.0–100.0)
Monocytes Absolute: 0.7 10*3/uL (ref 0.1–1.0)
Monocytes Relative: 9 %
Neutro Abs: 5.8 10*3/uL (ref 1.7–7.7)
Neutrophils Relative %: 72 %
Platelets: 253 10*3/uL (ref 150–400)
RBC: 4.99 MIL/uL (ref 3.87–5.11)
RDW: 14 % (ref 11.5–15.5)
WBC: 8.1 10*3/uL (ref 4.0–10.5)
nRBC: 0 % (ref 0.0–0.2)

## 2018-12-11 LAB — COMPREHENSIVE METABOLIC PANEL
ALT: 29 U/L (ref 0–44)
AST: 40 U/L (ref 15–41)
Albumin: 3.8 g/dL (ref 3.5–5.0)
Alkaline Phosphatase: 82 U/L (ref 38–126)
Anion gap: 15 (ref 5–15)
BUN: 23 mg/dL (ref 8–23)
CO2: 23 mmol/L (ref 22–32)
Calcium: 9.2 mg/dL (ref 8.9–10.3)
Chloride: 96 mmol/L — ABNORMAL LOW (ref 98–111)
Creatinine, Ser: 0.96 mg/dL (ref 0.44–1.00)
GFR calc Af Amer: 58 mL/min — ABNORMAL LOW (ref >60)
GFR calc non Af Amer: 50 mL/min — ABNORMAL LOW (ref >60)
Glucose, Bld: 106 mg/dL — ABNORMAL HIGH (ref 70–99)
Potassium: 3.6 mmol/L (ref 3.5–5.1)
Sodium: 134 mmol/L — ABNORMAL LOW (ref 135–145)
Total Bilirubin: 1.5 mg/dL — ABNORMAL HIGH (ref 0.3–1.2)
Total Protein: 7.2 g/dL (ref 6.5–8.1)

## 2018-12-11 LAB — TROPONIN I
Troponin I: 0.23 ng/mL (ref <0.03)
Troponin I: 0.3 ng/mL (ref <0.03)
Troponin I: 0.27 ng/mL (ref <0.03)
Troponin I: 0.28 ng/mL (ref <0.03)

## 2018-12-11 MED ORDER — LEVOTHYROXINE SODIUM 75 MCG PO TABS
75.0000 ug | ORAL_TABLET | ORAL | Status: DC
  Administered 2018-12-14: 75 ug via ORAL
  Filled 2018-12-11 (×3): qty 1
  Filled 2018-12-11: qty 3

## 2018-12-11 MED ORDER — ONDANSETRON HCL 4 MG/2ML IJ SOLN: 4.0000 mg | Freq: Four times a day (QID) | INTRAMUSCULAR | Status: DC | PRN

## 2018-12-11 MED ORDER — ENSURE ENLIVE PO LIQD
237.0000 mL | Freq: Two times a day (BID) | ORAL | Status: DC
  Administered 2018-12-11 – 2018-12-14 (×4): 237 mL via ORAL

## 2018-12-11 MED ORDER — VITAMIN B-12 1000 MCG PO TABS
1000.0000 ug | ORAL_TABLET | Freq: Every day | ORAL | Status: DC
  Administered 2018-12-11 – 2018-12-13 (×3): 1000 ug via ORAL
  Filled 2018-12-11 (×4): qty 1

## 2018-12-11 MED ORDER — MORPHINE SULFATE (PF) 2 MG/ML IV SOLN
2.0000 mg | INTRAVENOUS | Status: DC | PRN
  Administered 2018-12-11 – 2018-12-12 (×2): 2 mg via INTRAVENOUS
  Filled 2018-12-11 (×2): qty 1

## 2018-12-11 MED ORDER — GABAPENTIN 100 MG PO CAPS
100.0000 mg | ORAL_CAPSULE | Freq: Every day | ORAL | Status: DC
  Administered 2018-12-11 – 2018-12-13 (×3): 100 mg via ORAL
  Filled 2018-12-11 (×3): qty 1

## 2018-12-11 MED ORDER — ACETAMINOPHEN 500 MG PO TABS
1000.0000 mg | ORAL_TABLET | Freq: Once | ORAL | Status: AC
  Administered 2018-12-11: 11:00:00 1000 mg via ORAL
  Filled 2018-12-11: qty 2

## 2018-12-11 MED ORDER — VITAMIN D3 25 MCG (1000 UNIT) PO TABS
1000.0000 [IU] | ORAL_TABLET | Freq: Every day | ORAL | Status: DC
  Administered 2018-12-11 – 2018-12-14 (×4): 1000 [IU] via ORAL
  Filled 2018-12-11 (×4): qty 1

## 2018-12-11 MED ORDER — ATORVASTATIN CALCIUM 20 MG PO TABS
20.0000 mg | ORAL_TABLET | Freq: Every day | ORAL | Status: DC
  Administered 2018-12-11 – 2018-12-13 (×3): 20 mg via ORAL
  Filled 2018-12-11 (×3): qty 1

## 2018-12-11 MED ORDER — ACETAMINOPHEN 325 MG PO TABS
650.0000 mg | ORAL_TABLET | ORAL | Status: DC | PRN
  Administered 2018-12-14: 650 mg via ORAL
  Filled 2018-12-11: qty 2

## 2018-12-11 MED ORDER — HYDROCODONE-ACETAMINOPHEN 5-325 MG PO TABS
1.0000 | ORAL_TABLET | ORAL | Status: DC | PRN
  Administered 2018-12-11 – 2018-12-13 (×4): 2 via ORAL
  Filled 2018-12-11 (×4): qty 2

## 2018-12-11 MED ORDER — METOPROLOL TARTRATE 25 MG PO TABS
12.5000 mg | ORAL_TABLET | Freq: Two times a day (BID) | ORAL | Status: DC
  Administered 2018-12-11 – 2018-12-14 (×6): 12.5 mg via ORAL
  Filled 2018-12-11 (×6): qty 1

## 2018-12-11 MED ORDER — ONDANSETRON HCL 4 MG PO TABS: 4.0000 mg | ORAL_TABLET | Freq: Four times a day (QID) | ORAL | Status: DC | PRN

## 2018-12-11 NOTE — Consult Note (Signed)
Consultation Note Date: 12/11/2018   Patient Name: Gabriella Hawkins  DOB: 14-Nov-1919  MRN: [id number]  Age / Sex: 85 y.o., female  PCP: Danella Penton, MD Referring Physician: Auburn Bilberry, MD  Reason for Consultation: Establishing goals of care  HPI/Patient Profile: Coronary artery disease, carotid arthrosclerosis, previous CVA, hypertension who was actually in the hospital and discharged yesterday after she was admitted with dehydration, hyponatremia and acute kidney injury.  Clinical Assessment and Goals of Care: Patient is resting in ED bed. She is alert and oriented x4. She is able to tell me her name, that she is at [facility], that this is April ,and the year 2020, that Trump is president, and that she is here because of a fall and has "a compression fracture in her spine". She states she is waiting for the orthopedist to come and tell her what needs to be done for her back pain.   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 She states she has been widowed 12 years. 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 She has no children, and her twin sister is deceased. She states it is lonely now that all of her friends and family (besides the nephews) have died.   She states she has been declining for the past few years, but especially the pat year. She states she has falls. She used a cane until she lost it and now uses a walker. She states food is not as good as it used to be, and she does not eat as well as she used to.    We discussed her diagnosis, and GOC.  A detailed discussion was had today regarding advanced directives.   She states she has a DNR. She states she has talked to her nephews, and they know what decisions to make for her. She states she wants to treat the treatable as long her her quality of life does not decline further. She adds that really, she is not happy with her currently quality of life with loneliness and falls. She tells me she would never want a ventilator, or to live in a vegetative state.       Per palliative care consult note yesterday prior to discharge, a MOST form was completed for no rehospitaization if she becomes ill. Note also states there was discussion with the nephews about hospice care on discharge and a focus on comfort. She is currently followed by outpatient palliative care though she does not endorse seeing anyone she is aware is with palliative. She complains of back pain and states she is waiting for orthopedics.   SUMMARY OF RECOMMENDATIONS   Treat the treatable.   Prognosis:   Unable  to determine  Discharge Planning: To Be Determined      Primary Diagnoses: Present on Admission: . Back pain   I have reviewed the medical record, interviewed the patient and family, and examined the patient. The following aspects are pertinent.  Past Medical History:  Diagnosis Date  . CAD (coronary artery disease)   . Carotid atherosclerosis   . CVA (cerebral vascular accident) (HCC)   . Hypertension    Social History   Socioeconomic History  . Marital status: Widowed    Spouse name: Not on file  . Number of children: Not on file  . Years of education: Not on file  . Highest education level: Not on file  Occupational History  . Occupation: retired  Engineer, productionocial Needs  . Financial resource strain: Not on file  . Food insecurity:    Worry: Not on file    Inability: Not on file  . Transportation needs:    Medical: Not on file    Non-medical: Not on file  Tobacco Use  . Smoking status: Never Smoker  . Smokeless tobacco: Never Used  Substance and Sexual Activity  .  Alcohol use: No  . Drug use: No  . Sexual activity: Never  Lifestyle  . Physical activity:    Days per week: Not on file    Minutes per session: Not on file  . Stress: Not on file  Relationships  . Social connections:    Talks on phone: Not on file    Gets together: Not on file    Attends religious service: Not on file    Active member of club or organization: Not on file    Attends meetings of clubs or organizations: Not on file    Relationship status: Not on file  Other Topics Concern  . Not on file  Social History Narrative  . Not on file   Family History  Problem Relation Age of Onset  . Hypertension Neg Hx   . Diabetes Mellitus II Neg Hx    Scheduled Meds: Continuous Infusions: PRN Meds:.morphine injection Medications Prior to Admission:  Prior to Admission medications   Medication Sig Start Date End Date Taking? Authorizing Provider  acetaminophen (TYLENOL) 325 MG tablet Take 650 mg by mouth every 4 (four) hours as needed.    [provider]  aspirin EC 81 MG tablet Take 81 mg by mouth daily.    [provider]  atorvastatin (LIPITOR) 20 MG tablet Take 1 tablet (20 mg total) by mouth daily at 6 PM. 05/18/17   Katha HammingKonidena, Snehalatha, MD  Cholecalciferol (VITAMIN D-1000 MAX ST) 1000 units tablet Take 1,000 Units by mouth daily. 10/29/14   [provider]  clopidogrel (PLAVIX) 75 MG tablet Take 1 tablet (75 mg total) by mouth daily. 05/19/17   Katha HammingKonidena, Snehalatha, MD  gabapentin (NEURONTIN) 100 MG capsule Take 100 mg by mouth at bedtime. 04/18/17   [provider]  levothyroxine (SYNTHROID, LEVOTHROID) 75 MCG tablet Take 75 mcg by mouth every morning. 07/13/15   [provider]  metoprolol tartrate (LOPRESSOR) 25 MG tablet Take 0.5 tablets (12.5 mg total) by mouth 2 (two) times daily. 05/18/17   Katha HammingKonidena, Snehalatha, MD  vitamin B-12 (CYANOCOBALAMIN) 1000 MCG tablet Take 1,000 mcg by mouth daily.    [provider]    Allergies  Allergen Reactions  . Phenobarbital Other (See Comments)    Unknown   . Tramadol Other (See Comments)    Unknown  . Lisinopril Other (See Comments)  Dizziness    Review of Systems  Constitutional: Positive for activity change.  Musculoskeletal: Positive for back pain.    Physical Exam Pulmonary:     Effort: Pulmonary effort is normal.  Neurological:     Mental Status: She is alert and oriented to person, place, and time.     Vital Signs: BP 133/86   Pulse 81   Temp (!) 97.5 F (36.4 C) (Oral)   Resp 16   Ht 5\' 4"  (1.626 m)   Wt 66.7 kg   SpO2 97%   BMI 25.23 kg/m          SpO2: SpO2: 97 % O2 Device:SpO2: 97 % O2 Flow Rate: .   IO: Intake/output summary: No intake or output data in the 24 hours ending 12/11/18 1400  LBM:   Baseline Weight: Weight: 66.7 kg Most recent weight: Weight: 66.7 kg     Palliative Assessment/Data:     Time In: 1:20 Time Out: 2:10 Time Total: 50 min Greater than 50%  of this time was spent counseling and coordinating care related to the above assessment and plan.  Signed by: Morton Stall, NP   Please contact Palliative Medicine Team phone at [phone] for questions and concerns.  For individual provider: See Loretha Stapler

## 2018-12-11 NOTE — ED Triage Notes (Signed)
Pt in via [facility] from [facility]; per EMS, pt with unwitnessed fall.  Pt complains of back pain, pt is holding right hip upon arrival; various stages of bruising noted along right hip and leg.  Pt A/Ox4, NAD noted at this time.

## 2018-12-11 NOTE — ED Notes (Signed)
Hospitalist to bedside.

## 2018-12-11 NOTE — Care Management (Signed)
Rene Kocher from [facility] [phone]) contacted CSW and shared that she would like to be updated regarding pt so "she can keep everybody in the loop."    Murvin Donning  [facility] ED  [phone]

## 2018-12-11 NOTE — ED Provider Notes (Addendum)
[facility] Emergency Department Provider Note       Time seen: ----------------------------------------- 9:17 AM on 12/11/2018 -----------------------------------------   I have reviewed the triage vital signs and the nursing notes.  HISTORY   Chief Complaint Fall    HPI Gabriella Hawkins is a 85 y.o. female with a history of coronary artery disease, CVA, hypertension, palliative care, acute kidney injury who presents to the ED for an unwitnessed fall.  [facility] complaining of diffuse pain.  She was last seen normally yesterday evening.  She denies any recent illness but was seen recently for acute kidney injury  Past Medical History:  Diagnosis Date  . CAD (coronary artery disease)   . Carotid atherosclerosis   . CVA (cerebral vascular accident) (HCC)   . Hypertension     Patient Active Problem List   Diagnosis Date Noted  . Dehydration   . Palliative care by specialist   . Goals of care, counseling/discussion   . Encounter for hospice care discussion   . Acute kidney failure (HCC) 12/08/2018  . Chest pain 05/17/2017    Past Surgical History:  Procedure Laterality Date  . APPENDECTOMY    . CORONARY ARTERY BYPASS GRAFT    . RETINAL DETACHMENT SURGERY    . THYROID SURGERY      Allergies Phenobarbital; Tramadol; and Lisinopril  Social History Social History   Tobacco Use  . Smoking status: Never Smoker  . Smokeless tobacco: Never Used  Substance Use Topics  . Alcohol use: No  . Drug use: No    Review of Systems Positive for diffuse pain All systems negative/normal/unremarkable except as stated in the HPI  ____________________________________________   PHYSICAL EXAM:  VITAL SIGNS: ED Triage Vitals  Enc Vitals Group     BP      Pulse      Resp      Temp      Temp src      SpO2      Weight      Height      Head Circumference      Peak Flow      Pain Score      Pain Loc      Pain  Edu?      Excl. in GC?    Constitutional: Alert but disoriented.  No acute distress is noted ENT      Head: Normocephalic and atraumatic.      Nose: No congestion/rhinnorhea.      Mouth/Throat: Mucous membranes are moist.      Neck: No stridor. Cardiovascular: Normal rate, regular rhythm. No murmurs, rubs, or gallops. Respiratory: Normal respiratory effort without tachypnea nor retractions. Breath sounds are clear and equal bilaterally. No wheezes/rales/rhonchi. Gastrointestinal: Soft and nontender. Normal bowel sounds Musculoskeletal: Limited but unremarkable range of motion of the extremities Neurologic:  Normal speech and language. No gross focal neurologic deficits are appreciated.  Skin: Scattered bruises are appreciated around her extremities Psychiatric: Mood and affect are normal. Speech and behavior are normal.  ____________________________________________  EKG: Interpreted by me.  Sinus rhythm with rate of 88 bpm, left bundle branch block, PACs, long QT  ____________________________________________  ED COURSE:  As part of my medical decision making, I reviewed the following data within the electronic MEDICAL RECORD NUMBER History obtained from family if available, nursing notes, old chart and ekg, as well as notes from prior ED visits. Patient presented for an unwitnessed fall, we will assess with labs and  imaging as indicated at this time.   Procedures  Gabriella Hawkins was evaluated in Emergency Department on 12/11/2018 for the symptoms described in the history of present illness. She was evaluated in the context of the global COVID-19 pandemic, which necessitated consideration that the patient might be at risk for infection with the SARS-CoV-2 virus that causes COVID-19. Institutional protocols and algorithms that pertain to the evaluation of patients at risk for COVID-19 are in a state of rapid change based on information released by regulatory bodies including the CDC  and federal and state organizations. These policies and algorithms were followed during the patient's care in the ED.  ____________________________________________   LABS (pertinent positives/negatives)  Labs Reviewed  COMPREHENSIVE METABOLIC PANEL - Abnormal; Notable for the following components:      Result Value   Sodium 134 (*)    Chloride 96 (*)    Glucose, Bld 106 (*)    Total Bilirubin 1.5 (*)    GFR calc non Af Amer 50 (*)    GFR calc Af Amer 58 (*)    All other components within normal limits  URINALYSIS, COMPLETE (UACMP) WITH MICROSCOPIC - Abnormal; Notable for the following components:   Color, Urine YELLOW (*)    APPearance CLEAR (*)    Hgb urine dipstick SMALL (*)    Ketones, ur 20 (*)    Protein, ur 30 (*)    All other components within normal limits  TROPONIN I - Abnormal; Notable for the following components:   Troponin I 0.23 (*)    All other components within normal limits  CBC WITH DIFFERENTIAL/PLATELET  CBG MONITORING, ED    RADIOLOGY Images were viewed by me  Thoracic spine, lumbar sacral spine, pelvis x-rays IMPRESSION: New T10 compression fracture with approximately 50% height loss when compared to the chest x-ray 02/22/2018.  Unchanged configuration of T9 compression fracture.  Osteopenia and degenerative changes. ____________________________________________   DIFFERENTIAL DIAGNOSIS   Fall, contusion, palliative care, dehydration, electrolyte abnormality, occult infection  FINAL ASSESSMENT AND PLAN  Unwitnessed fall, thoracic compression fracture, elevated troponin   Plan: The patient had presented for an unwitnessed fall. Patient's labs did reveal an elevated troponin which is new, she has a chronic left bundle branch block on her EKG with no other acute findings. Patient's imaging did reveal a T10 compression fracture with 50% loss of height.  Given that she is a palliative care patient, we will consult palliative care to determine the  best course of action for her.   Ulice Dash, MD    Note: This note was generated in part or whole with voice recognition software. Voice recognition is usually quite accurate but there are transcription errors that can and very often do occur. I apologize for any typographical errors that were not detected and corrected.     Emily Filbert, MD 12/11/18 1020    Emily Filbert, MD 12/11/18 1149

## 2018-12-11 NOTE — Progress Notes (Signed)
Initial Nutrition Assessment  DOCUMENTATION CODES:   Not applicable  INTERVENTION:   Ensure Enlive po BID, each supplement provides 350 kcal and 20 grams of protein  Dysphagia 3 diet   NUTRITION DIAGNOSIS:   Inadequate oral intake related to social / environmental circumstances(advanced age ) as evidenced by meal completion < 50%.  GOAL:   Patient will meet greater than or equal to 90% of their needs  MONITOR:   PO intake, Supplement acceptance, Labs, Weight trends, I & O's, Skin  REASON FOR ASSESSMENT:   Malnutrition Screening Tool    ASSESSMENT:   85 y.o. female with past medical history of CAD with CABG, carotid atherosclerosis, hypertension, CVA, possible dementia admitted on 12/07/2018 with acute metabolic encephalopathy and discharged on 4/27 with palliative care now readmitted with fall and compression fracture   RD working remotely.  Per chart review, pt with poor appetite and oral intake at baseline. Pt eating 30% of meals during last admit. Pt seen by SLP who recommended dysphagia 3/thin liquid diet. RD will add supplements and liberalize diet. Per chart review, pt appears fairly weight stable pta. Palliative care following for GOC.    Pt at high risk for malnutrition but unable to diagnose at this time.   Medications reviewed and include: vitamin D, synthroid, B12  Labs reviewed: Na 134(L), Cl 96(L)  Unable to complete Nutrition-Focused physical exam at this time.   Diet Order:   Diet Order            DIET DYS 3 Room service appropriate? Yes; Fluid consistency: Thin  Diet effective now              EDUCATION NEEDS:   No education needs have been identified at this time  Skin:  Skin Assessment: Reviewed RN Assessment(ecchymosis, MASD)  Last BM:  pta  Height:   Ht Readings from Last 1 Encounters:  12/11/18 5\' 3"  (1.6 m)    Weight:   Wt Readings from Last 1 Encounters:  12/11/18 64.9 kg    Ideal Body Weight:  52.3 kg  BMI:  Body mass  index is 25.33 kg/m.  Estimated Nutritional Needs:   Kcal:  1300-1500kcal/day   Protein:  65-75g/day   Fluid:  >1.3L/day   Betsey Holiday MS, RD, LDN Pager #- [phone] Office#- [phone] After Hours Pager: [phone]

## 2018-12-11 NOTE — Progress Notes (Signed)
RN attempted to obtain TLSO Brace that is ordered. Call made to [facility] and [facility]. RN waited on return call. [facility], [facility] and charge nurse notified. I will continue to assess.

## 2018-12-11 NOTE — Progress Notes (Signed)
Advanced care plan.  Purpose of the Encounter: CODE STATUS  Parties in Attendance:herself  Patient's Decision Capacity:intact  Subjective/Patient's story: Gabriella Hawkins  is a 85 y.o. female with a known history of Coronary artery disease, carotid arthrosclerosis, previous CVA, hypertension who was actually in the hospital and discharged on the 10th 27th after she was admitted with dehydration, hyponatremia and acute kidney injury.  Patient was discharged to facility.  Patient having back pain.  In the ER evaluation showed that patient had T10 fracture.  She otherwise denies any chest pain or shortness of breath   Objective/Medical story I confirmed the patient regarding her CODE STATUS she says she is a DNR.  Patient also states that her nephews have healthcare power of attorney and a living will.  And are her decision makers   Goals of care determination:   DNR status confirmed, palliative care team also has been consulted CODE STATUS: DNR,    Time spent discussing advanced care planning: 16 minutes

## 2018-12-11 NOTE — Care Management Obs Status (Signed)
MEDICARE OBSERVATION STATUS NOTIFICATION   Patient Details  Name: Gabriella Hawkins MRN: [id number] Date of Birth: [birth year]   Medicare Observation Status Notification Given:  Yes    Tania Yaziel Brandon, LCSW 12/11/2018, 2:37 PM

## 2018-12-11 NOTE — H&P (Signed)
[facility] - [place] [facility]   PATIENT NAME: Gabriella Hawkins    MR#:  [id number]  DATE OF BIRTH:  1920-01-28  DATE OF ADMISSION:  12/11/2018  PRIMARY CARE PHYSICIAN: Danella Penton, MD   REQUESTING/REFERRING PHYSICIAN: Emily Filbert, MD  CHIEF COMPLAINT:   Chief Complaint  Patient presents with  . Fall    HISTORY OF PRESENT ILLNESS: Gabriella Hawkins  is a 85 y.o. female with a known history of Coronary artery disease, carotid arthrosclerosis, previous CVA, hypertension who was actually in the hospital and discharged on the 10th 27th after she was admitted with dehydration, hyponatremia and acute kidney injury.  Patient was discharged to facility.  Patient having back pain.  In the ER evaluation showed that patient had T10 fracture.  She otherwise denies any chest pain or shortness of breath.         PAST MEDICAL HISTORY:   Past Medical History:  Diagnosis Date  . CAD (coronary artery disease)   . Carotid atherosclerosis   . CVA (cerebral vascular accident) (HCC)   . Hypertension     PAST SURGICAL HISTORY:  Past Surgical History:  Procedure Laterality Date  . APPENDECTOMY    . CORONARY ARTERY BYPASS GRAFT    . RETINAL DETACHMENT SURGERY    . THYROID SURGERY      SOCIAL HISTORY:  Social History   Tobacco Use  . Smoking status: Never Smoker  . Smokeless tobacco: Never Used  Substance Use Topics  . Alcohol use: No    FAMILY HISTORY:  Family History  Problem Relation Age of Onset  . Hypertension Neg Hx   . Diabetes Mellitus II Neg Hx     DRUG ALLERGIES:  Allergies  Allergen Reactions  . Phenobarbital Other (See Comments)    Unknown   . Tramadol Other (See Comments)    Unknown  . Lisinopril Other (See Comments)    Dizziness     REVIEW OF SYSTEMS:   CONSTITUTIONAL: No fever, fatigue or weakness.  EYES: No blurred or double vision.  EARS, NOSE, AND THROAT: No tinnitus or ear pain.  RESPIRATORY: No cough, shortness of  breath, wheezing or hemoptysis.  CARDIOVASCULAR: No chest pain, orthopnea, edema.  GASTROINTESTINAL: No nausea, vomiting, diarrhea or abdominal pain.  GENITOURINARY: No dysuria, hematuria.  ENDOCRINE: No polyuria, nocturia,  HEMATOLOGY: No anemia, easy bruising or bleeding SKIN: No rash or lesion. MUSCULOSKELETAL: Positive back pain NEUROLOGIC: No tingling, numbness, weakness.  PSYCHIATRY: No anxiety or depression.   MEDICATIONS AT HOME:  Prior to Admission medications   Medication Sig Start Date End Date Taking? Authorizing Provider  acetaminophen (TYLENOL) 325 MG tablet Take 650 mg by mouth every 4 (four) hours as needed.    [provider]  aspirin EC 81 MG tablet Take 81 mg by mouth daily.    [provider]  atorvastatin (LIPITOR) 20 MG tablet Take 1 tablet (20 mg total) by mouth daily at 6 PM. 05/18/17   Katha Hamming, MD  Cholecalciferol (VITAMIN D-1000 MAX ST) 1000 units tablet Take 1,000 Units by mouth daily. 10/29/14   [provider]  clopidogrel (PLAVIX) 75 MG tablet Take 1 tablet (75 mg total) by mouth daily. 05/19/17   Katha Hamming, MD  gabapentin (NEURONTIN) 100 MG capsule Take 100 mg by mouth at bedtime. 04/18/17   [provider]  levothyroxine (SYNTHROID, LEVOTHROID) 75 MCG tablet Take 75 mcg by mouth every morning. 07/13/15   [provider]  metoprolol tartrate (LOPRESSOR) 25  MG tablet Take 0.5 tablets (12.5 mg total) by mouth 2 (two) times daily. 05/18/17   Katha Hamming, MD  vitamin B-12 (CYANOCOBALAMIN) 1000 MCG tablet Take 1,000 mcg by mouth daily.    [provider]      PHYSICAL EXAMINATION:   VITAL SIGNS: Blood pressure 139/78, pulse 82, temperature (!) 97.5 F (36.4 C), temperature source Oral, resp. rate 19, height  (1.626 m), weight 66.7 kg, SpO2 98 %.  GENERAL:  85 y.o.-year-old patient lying in the bed with no acute distress.  EYES: Pupils equal, round, reactive to light and  accommodation. No scleral icterus. Extraocular muscles intact.  HEENT: Head atraumatic, normocephalic. Oropharynx and nasopharynx clear.  NECK:  Supple, no jugular venous distention. No thyroid enlargement, no tenderness.  LUNGS: Normal breath sounds bilaterally, no wheezing, rales,rhonchi or crepitation. No use of accessory muscles of respiration.  CARDIOVASCULAR: S1, S2 normal. No murmurs, rubs, or gallops.  ABDOMEN: Soft, nontender, nondistended. Bowel sounds present. No organomegaly or mass.  EXTREMITIES: No pedal edema, cyanosis, or clubbing.  NEUROLOGIC: Cranial nerves II through XII are intact. Muscle strength 5/5 in all extremities. Sensation intact. Gait not checked.  PSYCHIATRIC: The patient is alert and oriented x 3.  SKIN: No obvious rash, lesion, or ulcer.   LABORATORY PANEL:   CBC Recent Labs  Lab 12/07/18 2256 12/11/18 0927  WBC 11.9* 8.1  HGB 13.3 14.2  HCT 39.0 43.0  PLT 234 253  MCV 85.3 86.2  MCH 29.1 28.5  MCHC 34.1 33.0  RDW 13.9 14.0  LYMPHSABS  --  1.4  MONOABS  --  0.7  EOSABS  --  0.1  BASOSABS  --  0.0   ------------------------------------------------------------------------------------------------------------------  Chemistries  Recent Labs  Lab 12/07/18 2256 12/08/18 0229 12/09/18 0431 12/11/18 0927  NA 129* 131* 133* 134*  K 3.2* 3.3* 3.4* 3.6  CL 91* 97* 96* 96*  CO2 GLUCOSE 183* 152* 106* 106*  BUN 57* 54* 23 23  CREATININE 2.58* 2.25* 1.13* 0.96  CALCIUM 9.1 8.5* 8.5* 9.2  AST 44*  --   --  40  ALT 38  --   --  29  ALKPHOS 76  --   --  82  BILITOT 1.0  --   --  1.5*   ------------------------------------------------------------------------------------------------------------------ estimated creatinine clearance is 32.2 mL/min (by C-G formula based on SCr of 0.96 mg/dL). ------------------------------------------------------------------------------------------------------------------ No results for input(s): TSH,  T4TOTAL, T3FREE, THYROIDAB in the last 72 hours.  Invalid input(s): FREET3   Coagulation profile No results for input(s): INR, PROTIME in the last 168 hours. ------------------------------------------------------------------------------------------------------------------- No results for input(s): DDIMER in the last 72 hours. -------------------------------------------------------------------------------------------------------------------  Cardiac Enzymes Recent Labs  Lab 12/07/18 2256 12/11/18 0927 12/11/18 1112  TROPONINI <0.03 0.23* 0.30*   ------------------------------------------------------------------------------------------------------------------ Invalid input(s): POCBNP  ---------------------------------------------------------------------------------------------------------------  Urinalysis    Component Value Date/Time   COLORURINE YELLOW (A) 12/11/2018 0927   APPEARANCEUR CLEAR (A) 12/11/2018 0927   APPEARANCEUR Hazy 04/22/2014 0937   LABSPEC 1.019 12/11/2018 0927   LABSPEC 1.008 04/22/2014 0937   PHURINE 6.0 12/11/2018 0927   GLUCOSEU NEGATIVE 12/11/2018 0927   GLUCOSEU Negative 04/22/2014 0937   HGBUR SMALL (A) 12/11/2018 0927   BILIRUBINUR NEGATIVE 12/11/2018 0927   BILIRUBINUR Negative 04/22/2014 0937   KETONESUR 20 (A) 12/11/2018 0927   PROTEINUR 30 (A) 12/11/2018 0927   NITRITE NEGATIVE 12/11/2018 0927   LEUKOCYTESUR NEGATIVE 12/11/2018 0927   LEUKOCYTESUR Trace 04/22/2014 0937     RADIOLOGY: Dg Thoracic  Spine 2 View  Result Date: 12/11/2018 CLINICAL DATA:  85 year old female with fall and right-sided back pain EXAM: THORACIC SPINE 2 VIEWS COMPARISON:  02/22/2018 FINDINGS: Thoracic Spine: Accentuated kyphotic curvature. Osteopenia. T9 compression fracture present on a prior plain film 02/22/2018. Relative unchanged degree of compression. There is a new T10 compression fracture with approximately 50% height loss. Multilevel degenerative changes  throughout the thoracic spine Unremarkable appearance of the visualized thorax. IMPRESSION: New T10 compression fracture with approximately 50% height loss when compared to the chest x-ray 02/22/2018. Unchanged configuration of T9 compression fracture. Osteopenia and degenerative changes. Electronically Signed   By: Gilmer Mor D.O.   On: 12/11/2018 09:53   Dg Lumbar Spine 2-3 Views  Result Date: 12/11/2018 CLINICAL DATA:  Status post fall, back pain EXAM: LUMBAR SPINE - 2-3 VIEW COMPARISON:  CT lumbar spine 04/01/2013 FINDINGS: There are 5 nonrib bearing lumbar-type vertebral bodies. There is a chronic L2 vertebral body compression fracture. The remainder the vertebral body heights are maintained. There is generalized osteopenia. There is a dextroscoliosis of the thoracolumbar spine. There is no static listhesis. There is no spondylolysis. There is no acute fracture. There is degenerative disc disease with disc height loss throughout the lumbar spine. Bilateral facet arthropathy throughout the lumbar spine most severe at L4-5 and L5-S1. The SI joints are unremarkable. There is abdominal aortic atherosclerosis. IMPRESSION: 1.  No acute osseous injury of the lumbar spine. 2. Lumbar spine spondylosis. Electronically Signed   By: Elige Ko   On: 12/11/2018 09:51   Dg Pelvis 1-2 Views  Result Date: 12/11/2018 CLINICAL DATA:  85 year old with fall and back pain. EXAM: PELVIS - 1-2 VIEW COMPARISON:  CT lumbar spine 04/01/2013 FINDINGS: Pelvic bony ring is intact. No gross abnormality to either hip. Atherosclerotic calcifications in the aorta and iliac arteries. Gas and stool in the rectum. IMPRESSION: No acute bone abnormality in the pelvis. Electronically Signed   By: Richarda Overlie M.D.   On: 12/11/2018 09:52   Ct Head Wo Contrast  Result Date: 12/11/2018 CLINICAL DATA:  Unwitnessed fall. Head trauma, intracranial venous injury suspected. EXAM: CT HEAD WITHOUT CONTRAST CT CERVICAL SPINE WITHOUT CONTRAST  TECHNIQUE: Multidetector CT imaging of the head and cervical spine was performed following the standard protocol without intravenous contrast. Multiplanar CT image reconstructions of the cervical spine were also generated. COMPARISON:  CT head without contrast 12/07/2018 FINDINGS: CT HEAD FINDINGS Brain: Moderate atrophy and white matter disease is similar the prior exam. No acute infarct, hemorrhage, or mass lesion is present. The ventricles are of proportionate to the degree of atrophy. No significant extraaxial fluid collection is present. The brainstem and cerebellum are within normal limits. Vascular: Atherosclerotic calcifications are present within the cavernous internal carotid arteries. There is no hyperdense vessel. Skull: No significant extracranial soft tissue injury is present. Calvarium is intact. No acute or healing fractures are present. Sinuses/Orbits: Paranasal sinuses and mastoid air cells are clear. Bilateral lens replacements are present. CT CERVICAL SPINE FINDINGS Alignment: Grade 1 anterolisthesis is present at C6-7 at C7-T1. There slight anterolisthesis at T1-2. Mild rightward curvature is present. There is no significant interval change. Skull base and vertebrae: Craniocervical junction is normal. No acute or healing fractures are present. Soft tissues and spinal canal: Atherosclerotic calcifications are present. Soft tissues are otherwise unremarkable. Stenosis appears worse on the left. Disc levels:  Multilevel degenerative changes are again noted. Upper chest: Scarring is present the lung apices. No focal nodule, mass, or airspace disease is present. IMPRESSION:  1. No acute trauma. 2. Stable atrophy and white matter disease. 3. Multilevel degenerative changes the cervical spine. 4. Atherosclerosis. Electronically Signed   By: Marin Robertshristopher  Mattern M.D.   On: 12/11/2018 10:17   Ct Cervical Spine Wo Contrast  Result Date: 12/11/2018 CLINICAL DATA:  Unwitnessed fall. Head trauma,  intracranial venous injury suspected. EXAM: CT HEAD WITHOUT CONTRAST CT CERVICAL SPINE WITHOUT CONTRAST TECHNIQUE: Multidetector CT imaging of the head and cervical spine was performed following the standard protocol without intravenous contrast. Multiplanar CT image reconstructions of the cervical spine were also generated. COMPARISON:  CT head without contrast 12/07/2018 FINDINGS: CT HEAD FINDINGS Brain: Moderate atrophy and white matter disease is similar the prior exam. No acute infarct, hemorrhage, or mass lesion is present. The ventricles are of proportionate to the degree of atrophy. No significant extraaxial fluid collection is present. The brainstem and cerebellum are within normal limits. Vascular: Atherosclerotic calcifications are present within the cavernous internal carotid arteries. There is no hyperdense vessel. Skull: No significant extracranial soft tissue injury is present. Calvarium is intact. No acute or healing fractures are present. Sinuses/Orbits: Paranasal sinuses and mastoid air cells are clear. Bilateral lens replacements are present. CT CERVICAL SPINE FINDINGS Alignment: Grade 1 anterolisthesis is present at C6-7 at C7-T1. There slight anterolisthesis at T1-2. Mild rightward curvature is present. There is no significant interval change. Skull base and vertebrae: Craniocervical junction is normal. No acute or healing fractures are present. Soft tissues and spinal canal: Atherosclerotic calcifications are present. Soft tissues are otherwise unremarkable. Stenosis appears worse on the left. Disc levels:  Multilevel degenerative changes are again noted. Upper chest: Scarring is present the lung apices. No focal nodule, mass, or airspace disease is present. IMPRESSION: 1. No acute trauma. 2. Stable atrophy and white matter disease. 3. Multilevel degenerative changes the cervical spine. 4. Atherosclerosis. Electronically Signed   By: Marin Robertshristopher  Mattern M.D.   On: 12/11/2018 10:17     EKG: Orders placed or performed during the hospital encounter of 12/11/18  . EKG 12-Lead  . EKG 12-Lead    IMPRESSION AND PLAN: Patient is 85 year old with history of coronary artery disease, carotid artery stenosis, previous CVA and hypertension presenting with a fall  1.  Fall with back pain due to T10 compression fracture We will ask orthopedics to see Pain control I will hold aspirin and Plavix  2.  Elevated troponin with history of coronary artery disease likely due to demand ischemia We will trend cardiac enzymes I will ask her cardiologist to see Continue metoprolol  3.  Hyperlipidemia continue Lipitor  4.  Hypothyroidism continue same  5.  Miscellaneous SCDs for DVT prophylaxis due to possible surgery    All the records are reviewed and case discussed with ED provider. Management plans discussed with the patient, family and they are in agreement.  CODE STATUS: Code Status History    Date Active Date Inactive Code Status Order ID Comments User Context   12/08/2018 0406 12/10/2018 1921 DNR [id number]  Raliegh ScarletSidney, Katrina, NP ED   12/08/2018 [phone] 12/08/2018 0406 DNR [id number]  Raliegh ScarletSidney, Katrina, NP ED   12/08/2018 [phone] 12/08/2018 0312 DNR [id number]  Raliegh ScarletSidney, Katrina, NP ED   05/17/2017 0605 05/18/2017 1809 DNR [id number]  Ihor AustinPyreddy, Pavan, MD ED    Questions for Most Recent Historical Code Status (Order [id number])    Question Answer Comment   In the event of cardiac or respiratory ARREST Do not call a "code blue"    In the event of cardiac or respiratory  ARREST Do not perform Intubation, CPR, defibrillation or ACLS    In the event of cardiac or respiratory ARREST Use medication by any route, position, wound care, and other measures to relive pain and suffering. May use oxygen, suction and manual treatment of airway obstruction as needed for comfort.         Advance Directive Documentation     Most Recent Value  Type of Advance Directive  Out of facility DNR (pink MOST or  yellow form)  Pre-existing out of facility DNR order (yellow form or pink MOST form)  Yellow form placed in chart (order not valid for inpatient use)  "MOST" Form in Place?  -       TOTAL TIME TAKING CARE OF THIS PATIENT:55 minutes.    Auburn Bilberry M.D on 12/11/2018 at 12:26 PM  Between 7am to 6pm - Pager - [phone]  After 6pm go to [email or web address] - password [employer]  [facility] Office  [phone]  CC: Primary care physician; Danella Penton, MD

## 2018-12-11 NOTE — ED Notes (Signed)
ED TO INPATIENT HANDOFF REPORT  ED Nurse Name and Phone #: Morrie Sheldon, RN [phone]  S Name/Age/Gender Gabriella Hawkins 85 y.o. female Room/Bed: ED02A/ED02A  Code Status   Code Status: Prior  Home/SNF/Other Nursing Home Patient oriented to: self, place, time and situation Is this baseline? Yes   Triage Complete: Triage complete  Chief Complaint Fall ems  Triage Note Pt in via [facility] from [facility]; per EMS, pt with unwitnessed fall.  Pt complains of back pain, pt is holding right hip upon arrival; various stages of bruising noted along right hip and leg.  Pt A/Ox4, NAD noted at this time.   Allergies Allergies  Allergen Reactions  . Phenobarbital Other (See Comments)    Unknown   . Tramadol Other (See Comments)    Unknown  . Lisinopril Other (See Comments)    Dizziness     Level of Care/Admitting Diagnosis ED Disposition    ED Disposition Condition Comment   Admit  Hospital Area: [facility] [[id number]]  Level of Care: Telemetry [5]  Covid Evaluation: N/A  Diagnosis: Back pain [191320]  Admitting Physician: Auburn Bilberry [[id number]]  Attending Physician: Auburn Bilberry [[id number]]  PT Class (Do Not Modify): Observation [104]  PT Acc Code (Do Not Modify): Observation [10022]       B Medical/Surgery History Past Medical History:  Diagnosis Date  . CAD (coronary artery disease)   . Carotid atherosclerosis   . CVA (cerebral vascular accident) (HCC)   . Hypertension    Past Surgical History:  Procedure Laterality Date  . APPENDECTOMY    . CORONARY ARTERY BYPASS GRAFT    . RETINAL DETACHMENT SURGERY    . THYROID SURGERY       A IV Location/Drains/Wounds Patient Lines/Drains/Airways Status   Active Line/Drains/Airways    Name:   Placement date:   Placement time:   Site:   Days:   Peripheral IV 12/11/18 Left Forearm   12/11/18    0924    Forearm   less than 1          Intake/Output Last 24 hours No intake or output data in the  24 hours ending 12/11/18 1309  Labs/Imaging Results for orders placed or performed during the hospital encounter of 12/11/18 (from the past 48 hour(s))  CBC with Differential     Status: None   Collection Time: 12/11/18  9:27 AM  Result Value Ref Range   WBC 8.1 4.0 - 10.5 K/uL   RBC 4.99 3.87 - 5.11 MIL/uL   Hemoglobin 14.2 12.0 - 15.0 g/dL   HCT 57.0 17.7 - 93.9 %   MCV 86.2 80.0 - 100.0 fL   MCH 28.5 26.0 - 34.0 pg   MCHC 33.0 30.0 - 36.0 g/dL   RDW 03.0 09.2 - 33.0 %   Platelets 253 150 - 400 K/uL   nRBC 0.0 0.0 - 0.2 %   Neutrophils Relative % 72 %   Neutro Abs 5.8 1.7 - 7.7 K/uL   Lymphocytes Relative 17 %   Lymphs Abs 1.4 0.7 - 4.0 K/uL   Monocytes Relative 9 %   Monocytes Absolute 0.7 0.1 - 1.0 K/uL   Eosinophils Relative 1 %   Eosinophils Absolute 0.1 0.0 - 0.5 K/uL   Basophils Relative 0 %   Basophils Absolute 0.0 0.0 - 0.1 K/uL   Immature Granulocytes 1 %   Abs Immature Granulocytes 0.07 0.00 - 0.07 K/uL    Comment: Performed at [facility], [address]  [place], [place] [address]  Comprehensive metabolic panel     Status: Abnormal   Collection Time: 12/11/18  9:27 AM  Result Value Ref Range   Sodium 134 (L) 135 - 145 mmol/L   Potassium 3.6 3.5 - 5.1 mmol/L   Chloride 96 (L) 98 - 111 mmol/L   CO2 23 22 - 32 mmol/L   Glucose, Bld 106 (H) 70 - 99 mg/dL   BUN 23 8 - 23 mg/dL   Creatinine, Ser 9.81 0.44 - 1.00 mg/dL   Calcium 9.2 8.9 - 19.1 mg/dL   Total Protein 7.2 6.5 - 8.1 g/dL   Albumin 3.8 3.5 - 5.0 g/dL   AST 40 15 - 41 U/L   ALT 29 0 - 44 U/L   Alkaline Phosphatase 82 38 - 126 U/L   Total Bilirubin 1.5 (H) 0.3 - 1.2 mg/dL   GFR calc non Af Amer 50 (L) >60 mL/min   GFR calc Af Amer 58 (L) >60 mL/min   Anion gap 15 5 - 15    Comment: Performed at [facility], [address] [place], [place] [address]  Urinalysis, Complete w Microscopic     Status: Abnormal   Collection Time: 12/11/18  9:27 AM  Result Value Ref Range   Color,  Urine YELLOW (A) YELLOW   APPearance CLEAR (A) CLEAR   Specific Gravity, Urine 1.019 1.005 - 1.030   pH 6.0 5.0 - 8.0   Glucose, UA NEGATIVE NEGATIVE mg/dL   Hgb urine dipstick SMALL (A) NEGATIVE   Bilirubin Urine NEGATIVE NEGATIVE   Ketones, ur 20 (A) NEGATIVE mg/dL   Protein, ur 30 (A) NEGATIVE mg/dL   Nitrite NEGATIVE NEGATIVE   Leukocytes,Ua NEGATIVE NEGATIVE   RBC / HPF 0-5 0 - 5 RBC/hpf   WBC, UA 0-5 0 - 5 WBC/hpf   Bacteria, UA NONE SEEN NONE SEEN   Squamous Epithelial / LPF 0-5 0 - 5   Mucus PRESENT     Comment: Performed at [facility], [address] [place], [place] [address]  Troponin I - ONCE - STAT     Status: Abnormal   Collection Time: 12/11/18  9:27 AM  Result Value Ref Range   Troponin I 0.23 (HH) <0.03 ng/mL    Comment: CRITICAL RESULT CALLED TO, READ BACK BY AND VERIFIED WITH  Kaidence Callaway AT 1005 12/11/2018 SDR Performed at [facility], [address] [place], [place] [address]   Troponin I - ONCE - STAT     Status: Abnormal   Collection Time: 12/11/18 11:12 AM  Result Value Ref Range   Troponin I 0.30 (HH) <0.03 ng/mL    Comment: CRITICAL VALUE NOTED. VALUE IS CONSISTENT WITH PREVIOUSLY REPORTED/CALLED VALUE MJU/SDR Performed at [facility], [address] [place] [place], [place] [address]    Dg Thoracic Spine 2 View  Result Date: 12/11/2018 CLINICAL DATA:  85 year old female with fall and right-sided back pain EXAM: THORACIC SPINE 2 VIEWS COMPARISON:  02/22/2018 FINDINGS: Thoracic Spine: Accentuated kyphotic curvature. Osteopenia. T9 compression fracture present on a prior plain film 02/22/2018. Relative unchanged degree of compression. There is a new T10 compression fracture with approximately 50% height loss. Multilevel degenerative changes throughout the thoracic spine Unremarkable appearance of the visualized thorax. IMPRESSION: New T10 compression fracture with approximately 50% height loss when compared to the chest x-ray  02/22/2018. Unchanged configuration of T9 compression fracture. Osteopenia and degenerative changes. Electronically Signed   By: Gilmer Mor D.O.   On: 12/11/2018 09:53  Dg Lumbar Spine 2-3 Views  Result Date: 12/11/2018 CLINICAL DATA:  Status post fall, back pain EXAM: LUMBAR SPINE - 2-3 VIEW COMPARISON:  CT lumbar spine 04/01/2013 FINDINGS: There are 5 nonrib bearing lumbar-type vertebral bodies. There is a chronic L2 vertebral body compression fracture. The remainder the vertebral body heights are maintained. There is generalized osteopenia. There is a dextroscoliosis of the thoracolumbar spine. There is no static listhesis. There is no spondylolysis. There is no acute fracture. There is degenerative disc disease with disc height loss throughout the lumbar spine. Bilateral facet arthropathy throughout the lumbar spine most severe at L4-5 and L5-S1. The SI joints are unremarkable. There is abdominal aortic atherosclerosis. IMPRESSION: 1.  No acute osseous injury of the lumbar spine. 2. Lumbar spine spondylosis. Electronically Signed   By: Elige Ko   On: 12/11/2018 09:51   Dg Pelvis 1-2 Views  Result Date: 12/11/2018 CLINICAL DATA:  85 year old with fall and back pain. EXAM: PELVIS - 1-2 VIEW COMPARISON:  CT lumbar spine 04/01/2013 FINDINGS: Pelvic bony ring is intact. No gross abnormality to either hip. Atherosclerotic calcifications in the aorta and iliac arteries. Gas and stool in the rectum. IMPRESSION: No acute bone abnormality in the pelvis. Electronically Signed   By: Richarda Overlie M.D.   On: 12/11/2018 09:52   Ct Head Wo Contrast  Result Date: 12/11/2018 CLINICAL DATA:  Unwitnessed fall. Head trauma, intracranial venous injury suspected. EXAM: CT HEAD WITHOUT CONTRAST CT CERVICAL SPINE WITHOUT CONTRAST TECHNIQUE: Multidetector CT imaging of the head and cervical spine was performed following the standard protocol without intravenous contrast. Multiplanar CT image reconstructions of the  cervical spine were also generated. COMPARISON:  CT head without contrast 12/07/2018 FINDINGS: CT HEAD FINDINGS Brain: Moderate atrophy and white matter disease is similar the prior exam. No acute infarct, hemorrhage, or mass lesion is present. The ventricles are of proportionate to the degree of atrophy. No significant extraaxial fluid collection is present. The brainstem and cerebellum are within normal limits. Vascular: Atherosclerotic calcifications are present within the cavernous internal carotid arteries. There is no hyperdense vessel. Skull: No significant extracranial soft tissue injury is present. Calvarium is intact. No acute or healing fractures are present. Sinuses/Orbits: Paranasal sinuses and mastoid air cells are clear. Bilateral lens replacements are present. CT CERVICAL SPINE FINDINGS Alignment: Grade 1 anterolisthesis is present at C6-7 at C7-T1. There slight anterolisthesis at T1-2. Mild rightward curvature is present. There is no significant interval change. Skull base and vertebrae: Craniocervical junction is normal. No acute or healing fractures are present. Soft tissues and spinal canal: Atherosclerotic calcifications are present. Soft tissues are otherwise unremarkable. Stenosis appears worse on the left. Disc levels:  Multilevel degenerative changes are again noted. Upper chest: Scarring is present the lung apices. No focal nodule, mass, or airspace disease is present. IMPRESSION: 1. No acute trauma. 2. Stable atrophy and white matter disease. 3. Multilevel degenerative changes the cervical spine. 4. Atherosclerosis. Electronically Signed   By: Marin Roberts M.D.   On: 12/11/2018 10:17   Ct Cervical Spine Wo Contrast  Result Date: 12/11/2018 CLINICAL DATA:  Unwitnessed fall. Head trauma, intracranial venous injury suspected. EXAM: CT HEAD WITHOUT CONTRAST CT CERVICAL SPINE WITHOUT CONTRAST TECHNIQUE: Multidetector CT imaging of the head and cervical spine was performed following  the standard protocol without intravenous contrast. Multiplanar CT image reconstructions of the cervical spine were also generated. COMPARISON:  CT head without contrast 12/07/2018 FINDINGS: CT HEAD FINDINGS Brain: Moderate atrophy and white matter disease is similar  the prior exam. No acute infarct, hemorrhage, or mass lesion is present. The ventricles are of proportionate to the degree of atrophy. No significant extraaxial fluid collection is present. The brainstem and cerebellum are within normal limits. Vascular: Atherosclerotic calcifications are present within the cavernous internal carotid arteries. There is no hyperdense vessel. Skull: No significant extracranial soft tissue injury is present. Calvarium is intact. No acute or healing fractures are present. Sinuses/Orbits: Paranasal sinuses and mastoid air cells are clear. Bilateral lens replacements are present. CT CERVICAL SPINE FINDINGS Alignment: Grade 1 anterolisthesis is present at C6-7 at C7-T1. There slight anterolisthesis at T1-2. Mild rightward curvature is present. There is no significant interval change. Skull base and vertebrae: Craniocervical junction is normal. No acute or healing fractures are present. Soft tissues and spinal canal: Atherosclerotic calcifications are present. Soft tissues are otherwise unremarkable. Stenosis appears worse on the left. Disc levels:  Multilevel degenerative changes are again noted. Upper chest: Scarring is present the lung apices. No focal nodule, mass, or airspace disease is present. IMPRESSION: 1. No acute trauma. 2. Stable atrophy and white matter disease. 3. Multilevel degenerative changes the cervical spine. 4. Atherosclerosis. Electronically Signed   By: Marin Robertshristopher  Mattern M.D.   On: 12/11/2018 10:17    Pending Labs Unresulted Labs (From admission, onward)    Start     Ordered   12/11/18 1239  Troponin I - Now Then Q6H  Now then every 6 hours,   STAT     12/11/18 1238   Signed and Held  CBC   Tomorrow morning,   R     Signed and Held   Signed and Held  Basic metabolic panel  Tomorrow morning,   R     Signed and Held          Vitals/Pain Today's Vitals   12/11/18 [id number] 12/11/18 0922 12/11/18 1130 12/11/18 1230  BP: (!) 159/85  139/78 136/81  Pulse: 78  82 92  Resp: 18  19   Temp: (!) 97.5 F (36.4 C)     TempSrc: Oral     SpO2: 96%  98% 97%  Weight:  66.7 kg    Height:  5\' 4"  (1.626 m)      Isolation Precautions No active isolations  Medications Medications  morphine 2 MG/ML injection 2 mg (has no administration in time range)  acetaminophen (TYLENOL) tablet 1,000 mg (1,000 mg Oral Given 12/11/18 1113)    Mobility walks with device High Fall Risk  Focused Assessments Neuro Assessment Handoff:  Swallow screen pass? N/A         Neuro Assessment: Within Defined Limits   R Recommendations: See Admitting Provider Note  Report given to:   Additional Notes: N/A

## 2018-12-11 NOTE — Care Management (Addendum)
RNCM spoke with Rosey Bath with [facility]. They have an order to see patient however they have need been able to visit with patient for assessment due to ED visit.  Rosey Bath spoke with [facility] and they have agreed to take patient back. TOC team to follow for needs. Patient is also followed by home palliative. Update at 1235: PT eval and COVID 19 culture requested from MD if SNF is anticipated.

## 2018-12-11 NOTE — Consult Note (Signed)
ORTHOPAEDIC CONSULTATION  PATIENT NAME: Gabriella Hawkins DOB: 07/11/1920  MRN: [id number]  REQUESTING PHYSICIAN: Auburn Bilberry, MD  Chief Complaint: Back pain  HPI: Gabriella Hawkins is a 85 y.o. female who complains of upper back pain.  She sustained an unwitnessed fall last night at [facility].  She complained of upper back pain and was subsequently transported to [facility] Emergency Department for further evaluation.  She denied any loss of consciousness.  She denied any other injuries.  The patient denied any radiation of pain down the leg.  She denied any numbness or gross weakness.  Radiographs demonstrated a previously undiagnosed T10 vertebral compression fracture.  She was admitted for pain control.  Past Medical History:  Diagnosis Date  . CAD (coronary artery disease)   . Carotid atherosclerosis   . CVA (cerebral vascular accident) (HCC)   . Hypertension    Past Surgical History:  Procedure Laterality Date  . APPENDECTOMY    . CORONARY ARTERY BYPASS GRAFT    . RETINAL DETACHMENT SURGERY    . THYROID SURGERY     Social History   Socioeconomic History  . Marital status: Widowed    Spouse name: Not on file  . Number of children: Not on file  . Years of education: Not on file  . Highest education level: Not on file  Occupational History  . Occupation: retired  Engineer, production  . Financial resource strain: Not on file  . Food insecurity:    Worry: Not on file    Inability: Not on file  . Transportation needs:    Medical: Not on file    Non-medical: Not on file  Tobacco Use  . Smoking status: Never Smoker  . Smokeless tobacco: Never Used  Substance and Sexual Activity  . Alcohol use: No  . Drug use: No  . Sexual activity: Never  Lifestyle  . Physical activity:    Days per week: Not on file    Minutes per session: Not on file  . Stress: Not on file  Relationships  . Social connections:    Talks on phone: Not on file    Gets  together: Not on file    Attends religious service: Not on file    Active member of club or organization: Not on file    Attends meetings of clubs or organizations: Not on file    Relationship status: Not on file  Other Topics Concern  . Not on file  Social History Narrative  . Not on file   Family History  Problem Relation Age of Onset  . Hypertension Neg Hx   . Diabetes Mellitus II Neg Hx    Allergies  Allergen Reactions  . Phenobarbital Other (See Comments)    Unknown   . Tramadol Other (See Comments)    Unknown  . Lisinopril Other (See Comments)    Dizziness    Prior to Admission medications   Medication Sig Start Date End Date Taking? Authorizing Provider  acetaminophen (TYLENOL) 325 MG tablet Take 650 mg by mouth every 4 (four) hours as needed.   Yes [provider]  amLODipine (NORVASC) 10 MG tablet Take 10 mg by mouth daily. 11/16/18  Yes [provider]  aspirin EC 81 MG tablet Take 81 mg by mouth daily.   Yes [provider]  atorvastatin (LIPITOR) 20 MG tablet Take 1 tablet (20 mg total) by mouth daily at 6 PM. 05/18/17  Yes Katha Hamming, MD  Cholecalciferol (VITAMIN D-1000 MAX ST)  1000 units tablet Take 1,000 Units by mouth daily. 10/29/14  Yes [provider]  clopidogrel (PLAVIX) 75 MG tablet Take 1 tablet (75 mg total) by mouth daily. 05/19/17  Yes Katha HammingKonidena, Snehalatha, MD  gabapentin (NEURONTIN) 100 MG capsule Take 100 mg by mouth at bedtime. 04/18/17  Yes [provider]  levothyroxine (SYNTHROID, LEVOTHROID) 75 MCG tablet Take 75 mcg by mouth every morning. 07/13/15  Yes [provider]  metoprolol tartrate (LOPRESSOR) 25 MG tablet Take 0.5 tablets (12.5 mg total) by mouth 2 (two) times daily. 05/18/17  Yes Katha HammingKonidena, Snehalatha, MD  vitamin B-12 (CYANOCOBALAMIN) 1000 MCG tablet Take 1,000 mcg by mouth daily.   Yes [provider]   Dg Thoracic Spine 2 View  Result Date: 12/11/2018 CLINICAL DATA:   85 year old female with fall and right-sided back pain EXAM: THORACIC SPINE 2 VIEWS COMPARISON:  02/22/2018 FINDINGS: Thoracic Spine: Accentuated kyphotic curvature. Osteopenia. T9 compression fracture present on a prior plain film 02/22/2018. Relative unchanged degree of compression. There is a new T10 compression fracture with approximately 50% height loss. Multilevel degenerative changes throughout the thoracic spine Unremarkable appearance of the visualized thorax. IMPRESSION: New T10 compression fracture with approximately 50% height loss when compared to the chest x-ray 02/22/2018. Unchanged configuration of T9 compression fracture. Osteopenia and degenerative changes. Electronically Signed   By: Gilmer MorJaime  Wagner D.O.   On: 12/11/2018 09:53   Dg Lumbar Spine 2-3 Views  Result Date: 12/11/2018 CLINICAL DATA:  Status post fall, back pain EXAM: LUMBAR SPINE - 2-3 VIEW COMPARISON:  CT lumbar spine 04/01/2013 FINDINGS: There are 5 nonrib bearing lumbar-type vertebral bodies. There is a chronic L2 vertebral body compression fracture. The remainder the vertebral body heights are maintained. There is generalized osteopenia. There is a dextroscoliosis of the thoracolumbar spine. There is no static listhesis. There is no spondylolysis. There is no acute fracture. There is degenerative disc disease with disc height loss throughout the lumbar spine. Bilateral facet arthropathy throughout the lumbar spine most severe at L4-5 and L5-S1. The SI joints are unremarkable. There is abdominal aortic atherosclerosis. IMPRESSION: 1.  No acute osseous injury of the lumbar spine. 2. Lumbar spine spondylosis. Electronically Signed   By: Elige KoHetal  Patel   On: 12/11/2018 09:51   Dg Pelvis 1-2 Views  Result Date: 12/11/2018 CLINICAL DATA:  85 year old with fall and back pain. EXAM: PELVIS - 1-2 VIEW COMPARISON:  CT lumbar spine 04/01/2013 FINDINGS: Pelvic bony ring is intact. No gross abnormality to either hip. Atherosclerotic  calcifications in the aorta and iliac arteries. Gas and stool in the rectum. IMPRESSION: No acute bone abnormality in the pelvis. Electronically Signed   By: Richarda OverlieAdam  Henn M.D.   On: 12/11/2018 09:52   Ct Head Wo Contrast  Result Date: 12/11/2018 CLINICAL DATA:  Unwitnessed fall. Head trauma, intracranial venous injury suspected. EXAM: CT HEAD WITHOUT CONTRAST CT CERVICAL SPINE WITHOUT CONTRAST TECHNIQUE: Multidetector CT imaging of the head and cervical spine was performed following the standard protocol without intravenous contrast. Multiplanar CT image reconstructions of the cervical spine were also generated. COMPARISON:  CT head without contrast 12/07/2018 FINDINGS: CT HEAD FINDINGS Brain: Moderate atrophy and white matter disease is similar the prior exam. No acute infarct, hemorrhage, or mass lesion is present. The ventricles are of proportionate to the degree of atrophy. No significant extraaxial fluid collection is present. The brainstem and cerebellum are within normal limits. Vascular: Atherosclerotic calcifications are present within the cavernous internal carotid arteries. There is no hyperdense vessel. Skull: No  significant extracranial soft tissue injury is present. Calvarium is intact. No acute or healing fractures are present. Sinuses/Orbits: Paranasal sinuses and mastoid air cells are clear. Bilateral lens replacements are present. CT CERVICAL SPINE FINDINGS Alignment: Grade 1 anterolisthesis is present at C6-7 at C7-T1. There slight anterolisthesis at T1-2. Mild rightward curvature is present. There is no significant interval change. Skull base and vertebrae: Craniocervical junction is normal. No acute or healing fractures are present. Soft tissues and spinal canal: Atherosclerotic calcifications are present. Soft tissues are otherwise unremarkable. Stenosis appears worse on the left. Disc levels:  Multilevel degenerative changes are again noted. Upper chest: Scarring is present the lung apices.  No focal nodule, mass, or airspace disease is present. IMPRESSION: 1. No acute trauma. 2. Stable atrophy and white matter disease. 3. Multilevel degenerative changes the cervical spine. 4. Atherosclerosis. Electronically Signed   By: Marin Roberts M.D.   On: 12/11/2018 10:17   Ct Cervical Spine Wo Contrast  Result Date: 12/11/2018 CLINICAL DATA:  Unwitnessed fall. Head trauma, intracranial venous injury suspected. EXAM: CT HEAD WITHOUT CONTRAST CT CERVICAL SPINE WITHOUT CONTRAST TECHNIQUE: Multidetector CT imaging of the head and cervical spine was performed following the standard protocol without intravenous contrast. Multiplanar CT image reconstructions of the cervical spine were also generated. COMPARISON:  CT head without contrast 12/07/2018 FINDINGS: CT HEAD FINDINGS Brain: Moderate atrophy and white matter disease is similar the prior exam. No acute infarct, hemorrhage, or mass lesion is present. The ventricles are of proportionate to the degree of atrophy. No significant extraaxial fluid collection is present. The brainstem and cerebellum are within normal limits. Vascular: Atherosclerotic calcifications are present within the cavernous internal carotid arteries. There is no hyperdense vessel. Skull: No significant extracranial soft tissue injury is present. Calvarium is intact. No acute or healing fractures are present. Sinuses/Orbits: Paranasal sinuses and mastoid air cells are clear. Bilateral lens replacements are present. CT CERVICAL SPINE FINDINGS Alignment: Grade 1 anterolisthesis is present at C6-7 at C7-T1. There slight anterolisthesis at T1-2. Mild rightward curvature is present. There is no significant interval change. Skull base and vertebrae: Craniocervical junction is normal. No acute or healing fractures are present. Soft tissues and spinal canal: Atherosclerotic calcifications are present. Soft tissues are otherwise unremarkable. Stenosis appears worse on the left. Disc levels:   Multilevel degenerative changes are again noted. Upper chest: Scarring is present the lung apices. No focal nodule, mass, or airspace disease is present. IMPRESSION: 1. No acute trauma. 2. Stable atrophy and white matter disease. 3. Multilevel degenerative changes the cervical spine. 4. Atherosclerosis. Electronically Signed   By: Marin Roberts M.D.   On: 12/11/2018 10:17    Positive ROS: All other systems have been reviewed and were otherwise negative with the exception of those mentioned in the HPI and as above.  Physical Exam: General: Well developed, well nourished elderly female seen in no acute distress. HEENT: Atraumatic and normocephalic. Sclera are clear. Extraocular motion is intact. Oropharynx is clear with moist mucosa. Neck: Supple, nontender, good range of motion.  Lungs: Clear to auscultation bilaterally. Cardiovascular: Regular rate and rhythm with normal S1 and S2.  2-3/6 murmur. No gallops or rubs. Pedal pulses are palpable bilaterally. Homans test is negative bilaterally. No significant pretibial or ankle edema. Abdomen: Soft, nontender, and nondistended. Bowel sounds are present. Skin: No lesions in the area of chief complaint Neurologic: Awake, alert, and oriented. Sensory function is grossly intact. Motor strength is felt to be 5 over 5 bilaterally. No clonus or tremor.  Good motor coordination. Lymphatic: No axillary or cervical lymphadenopathy  MUSCULOSKELETAL: Examination of the spine demonstrates tenderness to the thoracic region.  Mild paraspinous spasm is noted.  No gross tenderness to the lower lumbar region.  The patient demonstrates guarding with any attempted rotation, flexion, or extension of the back.  Assessment: T10 vertebral compression fracture  Plan: The findings were discussed with the patient and her nephew (via telephone).  MRI of the thoracic spine has been ordered to better evaluate the T10 area.  The patient may be a candidate for kyphoplasty.   I have notified Dr. Cheri Fowler and he will review the MRI when completed.  James P. Angie Fava M.D.

## 2018-12-11 NOTE — ED Notes (Signed)
Patient transported to X-ray 

## 2018-12-12 ENCOUNTER — Encounter: Payer: Self-pay | Admitting: Anesthesiology

## 2018-12-12 DIAGNOSIS — Z7189 Other specified counseling: Secondary | ICD-10-CM

## 2018-12-12 DIAGNOSIS — S22000A Wedge compression fracture of unspecified thoracic vertebra, initial encounter for closed fracture: Secondary | ICD-10-CM | POA: Diagnosis not present

## 2018-12-12 DIAGNOSIS — Z515 Encounter for palliative care: Secondary | ICD-10-CM

## 2018-12-12 LAB — BASIC METABOLIC PANEL
Anion gap: 12 (ref 5–15)
BUN: 32 mg/dL — ABNORMAL HIGH (ref 8–23)
CO2: 25 mmol/L (ref 22–32)
Calcium: 8.7 mg/dL — ABNORMAL LOW (ref 8.9–10.3)
Chloride: 96 mmol/L — ABNORMAL LOW (ref 98–111)
Creatinine, Ser: 1.16 mg/dL — ABNORMAL HIGH (ref 0.44–1.00)
GFR calc Af Amer: 46 mL/min — ABNORMAL LOW (ref >60)
GFR calc non Af Amer: 40 mL/min — ABNORMAL LOW (ref >60)
Glucose, Bld: 136 mg/dL — ABNORMAL HIGH (ref 70–99)
Potassium: 3.8 mmol/L (ref 3.5–5.1)
Sodium: 133 mmol/L — ABNORMAL LOW (ref 135–145)

## 2018-12-12 LAB — CBC
HCT: 36.2 % (ref 36.0–46.0)
Hemoglobin: 12.2 g/dL (ref 12.0–15.0)
MCH: 28.4 pg (ref 26.0–34.0)
MCHC: 33.7 g/dL (ref 30.0–36.0)
MCV: 84.4 fL (ref 80.0–100.0)
Platelets: 249 10*3/uL (ref 150–400)
RBC: 4.29 MIL/uL (ref 3.87–5.11)
RDW: 13.9 % (ref 11.5–15.5)
WBC: 7.1 10*3/uL (ref 4.0–10.5)
nRBC: 0 % (ref 0.0–0.2)

## 2018-12-12 LAB — TROPONIN I: Troponin I: 0.29 ng/mL (ref <0.03)

## 2018-12-12 MED ORDER — CEFAZOLIN SODIUM-DEXTROSE 1-4 GM/50ML-% IV SOLN
1.0000 g | Freq: Once | INTRAVENOUS | Status: AC
  Administered 2018-12-13: 1 g via INTRAVENOUS
  Filled 2018-12-12: qty 50

## 2018-12-12 MED ORDER — SODIUM CHLORIDE 0.9% FLUSH
10.0000 mL | Freq: Two times a day (BID) | INTRAVENOUS | Status: DC
  Administered 2018-12-12 – 2018-12-14 (×3): 10 mL via INTRAVENOUS

## 2018-12-12 NOTE — NC FL2 (Signed)
[place] MEDICAID FL2 LEVEL OF CARE SCREENING TOOL     IDENTIFICATION  Patient Name: Gabriella Hawkins Birthdate: Oct 22, 1919 Sex: female Admission Date (Current Location): 12/11/2018  [place] Number:  Chiropodist and Address:  [facility], [address] [place], [place] [address]      Provider Number: [id number]  Attending Physician Name and Address:  Adrian Saran, MD  Relative Name and Phone Number:  Carroll Sage [phone]    Current Level of Care: Hospital Recommended Level of Care: Skilled Nursing Facility Prior Approval Number:    Date Approved/Denied:   PASRR Number: [id number]  Discharge Plan: SNF    Current Diagnoses: Patient Active Problem List   Diagnosis Date Noted  . Back pain 12/11/2018  . Mixed hyperlipidemia 12/11/2018  . Dehydration   . Palliative care by specialist   . Goals of care, counseling/discussion   . Encounter for hospice care discussion   . Acute kidney failure (HCC) 12/08/2018  . Venous insufficiency of both lower extremities 06/25/2018  . Chest pain 05/17/2017  . B12 deficiency 01/26/2016  . Epiretinal membrane (ERM) of left eye 11/03/2015  . CRAO (central retinal artery occlusion), right 11/03/2015  . Severe tricuspid valve insufficiency 02/23/2015  . Benign essential hypertension 01/23/2015  . Acquired hypothyroidism 10/29/2014  . Moderate mitral insufficiency 07/30/2014  . Chronic pulmonary hypertension (HCC) 07/30/2014  . Restless leg syndrome 04/30/2014  . LBBB (left bundle branch block) 11/19/2013  . CAD (coronary artery disease) of artery bypass graft 11/19/2013    Orientation RESPIRATION BLADDER Height & Weight        Normal Incontinent Weight: 64.9 kg Height:  5\' 3"  (160 cm)  BEHAVIORAL SYMPTOMS/MOOD NEUROLOGICAL BOWEL NUTRITION STATUS      Incontinent Diet(regular)  AMBULATORY STATUS COMMUNICATION OF NEEDS Skin   Limited Assist Verbally Skin abrasions                        Personal Care Assistance Level of Assistance  Bathing, Feeding, Dressing Bathing Assistance: Limited assistance Feeding assistance: Independent Dressing Assistance: Independent     Functional Limitations Info  Sight, Hearing, Speech Sight Info: Adequate Hearing Info: Adequate Speech Info: Adequate    SPECIAL CARE FACTORS FREQUENCY  PT (By licensed PT)     PT Frequency: 5xweek              Contractures Contractures Info: Not present    Additional Factors Info  Code Status, Allergies Code Status Info: DNR Allergies Info: Phenobarbital; Tramadol; Lisinopril           Current Medications (12/12/2018):  This is the current hospital active medication list Current Facility-Administered Medications  Medication Dose Route Frequency Provider Last Rate Last Dose  . acetaminophen (TYLENOL) tablet 650 mg  650 mg Oral Q4H PRN Auburn Bilberry, MD      . atorvastatin (LIPITOR) tablet 20 mg  20 mg Oral q1800 Auburn Bilberry, MD   20 mg at 12/11/18 1850  . [START ON 12/13/2018] ceFAZolin (ANCEF) IVPB 1 g/50 mL premix  1 g Intravenous Once Kennedy Bucker, MD      . cholecalciferol (VITAMIN D) tablet 1,000 Units  1,000 Units Oral Daily Auburn Bilberry, MD   1,000 Units at 12/12/18 1009  . feeding supplement (ENSURE ENLIVE) (ENSURE ENLIVE) liquid 237 mL  237 mL Oral BID BM Auburn Bilberry, MD   237 mL at 12/12/18 1009  . gabapentin (NEURONTIN) capsule 100 mg  100 mg Oral QHS  Auburn BilberryPatel, Shreyang, MD   100 mg at 12/11/18 2328  . HYDROcodone-acetaminophen (NORCO/VICODIN) 5-325 MG per tablet 1-2 tablet  1-2 tablet Oral Q4H PRN Auburn BilberryPatel, Shreyang, MD   2 tablet at 12/11/18 1849  . levothyroxine (SYNTHROID) tablet 75 mcg  75 mcg Oral Shirleen SchirmerBH-q7a Patel, [place] River[place], MD      . metoprolol tartrate (LOPRESSOR) tablet 12.5 mg  12.5 mg Oral BID Auburn BilberryPatel, Shreyang, MD   12.5 mg at 12/12/18 1009  . morphine 2 MG/ML injection 2 mg  2 mg Intravenous Q4H PRN Auburn BilberryPatel, Shreyang, MD   2 mg at 12/12/18 0506   . ondansetron (ZOFRAN) tablet 4 mg  4 mg Oral Q6H PRN Auburn BilberryPatel, Shreyang, MD       Or  . ondansetron [facility](ZOFRAN) injection 4 mg  4 mg Intravenous Q6H PRN Auburn BilberryPatel, Shreyang, MD      . vitamin B-12 (CYANOCOBALAMIN) tablet 1,000 mcg  1,000 mcg Oral Daily Auburn BilberryPatel, Shreyang, MD   1,000 mcg at 12/12/18 1008     Discharge Medications: Please see discharge summary for a list of discharge medications.  Relevant Imaging Results:  Relevant Lab Results:   Additional Information ssn:  Sherren KernsJennifer L Shayne Diguglielmo, RN

## 2018-12-12 NOTE — Progress Notes (Signed)
Daily Progress Note   Patient Name: Gabriella Hawkins       Date: 12/12/2018 DOB: 06/10/1920  Age: 85 y.o. MRN#: [id number] Attending Physician: Adrian Saran, MD Primary Care Physician: Danella Penton, MD Admit Date: 12/11/2018  Reason for Consultation/Follow-up: Establishing goals of care  Subjective: Patient is resting in bed. She states orthopedics wanted an MRI, but she is unsure of the plan. We discussed her possible upcoming kyphoplasty. She states she feels she is too old for surgery, and back surgery is something to really think about. She states the pain is not that severe. She states she would like to try nonsurgical methods (TLSO brace) and see how she does with getting out of bed (with therapy). If the pain is too great, she will give surgery further thought. Discussed that ortho surgery would have to speak with her further regarding if her fx would heal with a brace vs just stabilizing the fx when out of bed. Concern for quality of life.   Length of Stay: 0  Current Medications: Scheduled Meds:  . atorvastatin  20 mg Oral q1800  . cholecalciferol  1,000 Units Oral Daily  . feeding supplement (ENSURE ENLIVE)  237 mL Oral BID BM  . gabapentin  100 mg Oral QHS  . levothyroxine  75 mcg Oral BH-q7a  . metoprolol tartrate  12.5 mg Oral BID  . vitamin B-12  1,000 mcg Oral Daily    Continuous Infusions:   PRN Meds: acetaminophen, HYDROcodone-acetaminophen, morphine injection, ondansetron **OR** ondansetron (ZOFRAN) IV  Physical Exam Pulmonary:     Effort: Pulmonary effort is normal.  Skin:    General: Skin is warm and dry.  Neurological:     Mental Status: She is alert.     Comments: Answers questions appropriately.              Vital Signs: BP 109/71 (BP Location:  Right Arm)   Pulse 65   Temp 97.9 F (36.6 C) (Oral)   Resp 18   Ht 5\' 3"  (1.6 m)   Wt 64.9 kg   SpO2 96%   BMI 25.33 kg/m  SpO2: SpO2: 96 % O2 Device: O2 Device: Room Air O2 Flow Rate:    Intake/output summary:   Intake/Output Summary (Last 24 hours) at 12/12/2018 1007 Last data filed at 12/11/2018 1825  Gross per 24 hour  Intake -  Output 0 ml  Net 0 ml   LBM: Last BM Date: (pt unsure) Baseline Weight: Weight: 66.7 kg Most recent weight: Weight: 64.9 kg       Palliative Assessment/Data:    Flowsheet Rows     Most Recent Value  Intake Tab  Referral Department  -- [ED]  Unit at Time of Referral  ER  Date Notified  12/11/18  Palliative Care Type  Return patient Palliative Care  Reason for referral  Clarify Goals of Care  Date of Admission  12/11/18  Date first seen by Palliative Care  12/11/18  # of days Palliative referral response time  0 Day(s)  # of days IP prior to Palliative referral  0  Clinical Assessment  Psychosocial & Spiritual Assessment  Palliative Care Outcomes      Patient Active Problem List   Diagnosis Date Noted  . Back pain 12/11/2018  . Mixed hyperlipidemia 12/11/2018  . Dehydration   . Palliative care by specialist   . Goals of care, counseling/discussion   . Encounter for hospice care discussion   . Acute kidney failure (HCC) 12/08/2018  . Venous insufficiency of both lower extremities 06/25/2018  . Chest pain 05/17/2017  . B12 deficiency 01/26/2016  . Epiretinal membrane (ERM) of left eye 11/03/2015  . CRAO (central retinal artery occlusion), right 11/03/2015  . Severe tricuspid valve insufficiency 02/23/2015  . Benign essential hypertension 01/23/2015  . Acquired hypothyroidism 10/29/2014  . Moderate mitral insufficiency 07/30/2014  . Chronic pulmonary hypertension (HCC) 07/30/2014  . Restless leg syndrome 04/30/2014  . LBBB (left bundle branch block) 11/19/2013  . CAD (coronary artery disease) of artery bypass graft  11/19/2013    Palliative Care Assessment & Plan   Recommendations/Plan:  Patient would like to try nonsurgical options if possible.     Code Status:    Code Status Orders  (From admission, onward)         Start     Ordered   12/11/18 1426  Do not attempt resuscitation (DNR)  Continuous    Question Answer Comment  In the event of cardiac or respiratory ARREST Do not call a "code blue"   In the event of cardiac or respiratory ARREST Do not perform Intubation, CPR, defibrillation or ACLS   In the event of cardiac or respiratory ARREST Use medication by any route, position, wound care, and other measures to relive pain and suffering. May use oxygen, suction and manual treatment of airway obstruction as needed for comfort.      12/11/18 1425        Code Status History    Date Active Date Inactive Code Status Order ID Comments User Context   12/08/2018 0406 12/10/2018 1921 DNR [id number]  Raliegh Scarlet, NP ED   12/08/2018 [id number] 12/08/2018 0406 DNR [id number]  Raliegh Scarlet, NP ED   12/08/2018 [id number] 12/08/2018 0312 DNR [id number]  Raliegh Scarlet, NP ED   05/17/2017 0605 05/18/2017 1809 DNR [id number]  Ihor Austin, MD ED    Advance Directive Documentation     Most Recent Value  Type of Advance Directive  Out of facility DNR (pink MOST or yellow form)  Pre-existing out of facility DNR order (yellow form or pink MOST form)  Yellow form placed in chart (order not valid for inpatient use)  "MOST" Form in Place?  -       Prognosis:   Unable to determine  Discharge Planning:  To Be Determined  Care  plan was discussed with primary team. IM sent to Dr. Rosita KeaMenz.   Thank you for allowing the Palliative Medicine Team to assist in the care of this patient.   Time In: 9:35 Time Out: 10:25 Total Time 50 min Prolonged Time Billed  no      Greater than 50%  of this time was spent counseling and coordinating care related to the above assessment and plan.  Morton Stallrystal Quindon Denker, NP  Please  contact Palliative Medicine Team phone at [phone] for questions and concerns.

## 2018-12-12 NOTE — Anesthesia Preprocedure Evaluation (Addendum)
Anesthesia Evaluation  Patient identified by MRN, date of birth, ID band Patient awake    Reviewed: Allergy & Precautions, NPO status , Patient's Chart, lab work & pertinent test results  Airway   TM Distance: >3 FB Neck ROM: limited   Comment: Unable to cooperate with mallampati Dental  (+) Chipped   Pulmonary  Moderate pulmonary hypertension          Cardiovascular hypertension, (-) angina+ CAD  (-) CHF + dysrhythmias (chronic LBBB)   Stable elevated troponins 0.29.  Cardiology consulted, thinks demand ischemia, moderate risk for procedure, no further evaluation recommended at this time   Neuro/Psych PSYCHIATRIC DISORDERS Dementia CVA    GI/Hepatic Neg liver ROS,   Endo/Other  Hypothyroidism   Renal/GU Renal disease     Musculoskeletal   Abdominal   Peds negative pediatric ROS (+)  Hematology negative hematology ROS (+)   Anesthesia Other Findings Past Medical History: No date: CAD (coronary artery disease) No date: Carotid atherosclerosis No date: CVA (cerebral vascular accident) ([place]) No date: Hypertension   Patient on Plavix  Trponons are 0.28 and 0.29  CHRONIC LBBB  Reproductive/Obstetrics                        Anesthesia Physical Anesthesia Plan  ASA: IV  Anesthesia Plan: MAC   Post-op Pain Management:    Induction:   PONV Risk Score and Plan: TIVA  Airway Management Planned: Nasal Cannula  Additional Equipment:   Intra-op Plan:   Post-operative Plan:   Informed Consent:   Plan Discussed with: Anesthesiologist  Anesthesia Plan Comments: (Phone consent obtained from nephew/HCPOA at [phone].)      Anesthesia Quick Evaluation

## 2018-12-12 NOTE — Progress Notes (Addendum)
No OR time available today, plan kyphoplasty tomorrow  Discussed procedure at length with her.  She says she hurts if she moves. No clonus. Will come back later today to discuss again.

## 2018-12-12 NOTE — Evaluation (Addendum)
Physical Therapy Evaluation Patient Details Name: Gabriella Hawkins MRN: [id number] DOB: 08-26-1919 Today's Date: 12/12/2018   History of Present Illness  85 year old female with a history of CAD and hypertension who presented to the emergency room due to back pain and found to have T10 fracture.  Clinical Impression  Patient in bed and awake on PT arrival. Patient mentioned several times throughout the session that she would be undergoing surgery in the morning; patient also states during the session "I don't know who is going to give me a ride back to [facility] Patient was amenable to therapy and was able to perform bed mobility with Mod Ind/SBA, transfers and gait with CGA and RW. Patient requires increased cueing for safety during transfers and gait with mild-moderate unsteadiness noted. Patient denied pain throughout the session, but grimaced with transfers. Patient needed redirection to task more frequently at the end of the session with noted difficulty following two-step commands. Patient will benefit from skilled therapeutic intervention to address deficits in safety awareness and safe DME use, as well as strength and balance. Patient's current presentation suggests need for supervision OOB and 24/7 to ensure safety and decrease readmission and recurrent falls. Patient has decreased understanding of her limitations, but agrees "it would be good to have someone around for safety." If patient is is able to achieve 24/7 supervision while living at [facility], she will be safe for discharge there. However, if patient is not able to have supervision 24/7 living independently, she would be safest at a long-term care facility/SNF.    Patient in bed with alarm set and phone in reach. All needs met.       Follow Up Recommendations Supervision/Assistance - 24 hour;Supervision for mobility/OOB;Home health PT;SNF    Equipment Recommendations  Standard walker    Recommendations for Other  Services       Precautions / Restrictions Precautions Precautions: Fall Restrictions Weight Bearing Restrictions: No      Mobility  Bed Mobility Overal bed mobility: Modified Independent Bed Mobility: Supine to Sit;Sit to Supine     Supine to sit: Modified independent (Device/Increase time);Supervision Sit to supine: Modified independent (Device/Increase time);Supervision   General bed mobility comments: HOB elevated; use of bed rails. Increased time for sequencing and min VCs for sitting EOB.  Transfers Overall transfer level: Needs assistance Equipment used: Rolling walker (2 wheeled) Transfers: Sit to/from Stand Sit to Stand: Min guard         General transfer comment: Patient able to perform STS from low bed surface with use of BUE support; some noted unsteadiness when trying to place hands on RW.   Ambulation/Gait Ambulation/Gait assistance: Min guard Gait Distance (Feet): 50 Feet Assistive device: Rolling walker (2 wheeled) Gait Pattern/deviations: WFL(Within Functional Limits);Trunk flexed   Gait velocity interpretation: <1.8 ft/sec, indicate of risk for recurrent falls General Gait Details: Patient able to ambulate with RW with min cueing for keeping RW within BOS. Patient demonstrates some confusion navigating turns and has limited ability to follow two-step commands at end of session. Patient demonstrating decreased safety awareness at end of session requiring frequent cueing to remain with RW.   Stairs            Wheelchair Mobility    Modified Rankin (Stroke Patients Only)       Balance Overall balance assessment: Needs assistance;History of Falls Sitting-balance support: Bilateral upper extremity supported;Feet supported Sitting balance-Leahy Scale: Fair Sitting balance - Comments: Patient able to tolerate some challenge with feet supported. Postural  control: Posterior lean;Left lateral lean Standing balance support: Bilateral upper extremity  supported;During functional activity Standing balance-Leahy Scale: Poor Standing balance comment: Patient demonstrates increased unsteadiness in the absence of BUE support; no significant LOB. Patient not safe without UE support.                             Pertinent Vitals/Pain Pain Assessment: Faces Faces Pain Scale: Hurts a little bit Pain Location: during transfers Pain Intervention(s): Limited activity within patient's tolerance;Monitored during session;Repositioned    Home Living Family/patient expects to be discharged to:: Other (Comment)(Patient expresses she wants to return to [facility]. Patient is not a reliable historian.)               Home Equipment: Cane - single point;Walker - 4 wheels Additional Comments: Patient states that she has round the clock help if she needs it; unclear the reliability of this statement.    Prior Function Level of Independence: Independent with assistive device(s)   Gait / Transfers Assistance Needed: SPC used and per patient and no difficulties in [place]        Extremity/Trunk Assessment   Upper Extremity Assessment Upper Extremity Assessment: Overall WFL for tasks assessed;Generalized weakness    Lower Extremity Assessment Lower Extremity Assessment: Overall WFL for tasks assessed;Generalized weakness    Cervical / Trunk Assessment Cervical / Trunk Assessment: Kyphotic  Communication   Communication: No difficulties  Cognition Arousal/Alertness: Lethargic Behavior During Therapy: WFL for tasks assessed/performed;Flat affect Overall Cognitive Status: No family/caregiver present to determine baseline cognitive functioning                                 General Comments: Per chart: patient has dementia at baseline and was recently admitted 2/2 to altered mental status. Patient has moments of complete lucidity, and moments of confusion. Always pleasant.       General Comments      Exercises Other Exercises Other Exercises: Functional transfers and ambulation: RW, CGA, sit<>stand, 50 feet ambulation. No significant LOB. Patient requires frequent cueing to perform turns, remain with AD, and return to room.   Assessment/Plan    PT Assessment Patient needs continued PT services  PT Problem List Decreased strength;Pain;Decreased cognition;Decreased range of motion;Decreased activity tolerance;Decreased balance;Decreased mobility;Decreased safety awareness;Decreased knowledge of use of DME       PT Treatment Interventions Therapeutic exercise;Balance training;Gait training;Stair training;Neuromuscular re-education;Functional mobility training;Therapeutic activities;Patient/family education;DME instruction    PT Goals (Current goals can be found in the Care Plan section)  Acute Rehab PT Goals Patient Stated Goal: go home PT Goal Formulation: With patient Time For Goal Achievement: 12/26/18 Potential to Achieve Goals: Fair    Frequency Min 2X/week   Barriers to discharge        Co-evaluation               AM-PAC PT "6 Clicks" Mobility  Outcome Measure Help needed turning from your back to your side while in a flat bed without using bedrails?: A Little Help needed moving from lying on your back to sitting on the side of a flat bed without using bedrails?: A Little Help needed moving to and from a bed to a chair (including a wheelchair)?: A Little Help needed standing up from a chair using your arms (e.g., wheelchair or  bedside chair)?: A Little Help needed to walk in hospital room?: A Little Help needed climbing 3-5 steps with a railing? : A Lot 6 Click Score: 17    End of Session Equipment Utilized During Treatment: Gait belt Activity Tolerance: Patient tolerated treatment well Patient left: in bed;with call bell/phone within reach;with bed alarm set Nurse Communication: Mobility status PT Visit Diagnosis: Difficulty in  walking, not elsewhere classified (R26.2);Muscle weakness (generalized) (M62.81);Pain;History of falling (Z91.81)    Time: 6834-1962 PT Time Calculation (min) (ACUTE ONLY): 25 min   Charges:   PT Evaluation $PT Eval Low Complexity: 1 Low PT Treatments $Therapeutic Activity: 8-22 mins       Myles Gip PT, DPT [phone] 12/12/2018, 2:30 PM

## 2018-12-12 NOTE — TOC Initial Note (Signed)
Transition of Care [facility](TOC) - Initial/Assessment Note    Patient Details  Name: Gabriella Hawkins MRN: [id number] Date of Birth: 03/05/1920  Transition of Care [facility](TOC) CM/SW Contact:    Sherren KernsJennifer L Bennye Nix, RN Phone Number: 12/12/2018, 3:59 PM  Clinical Narrative:      [facility].  Patient has dementia and has periods of orientation and then periods of disorientation.  Spoke with nephew, [name] HPOA [phone].  He states she lives alone and is not safe to return to independent living.  Patient is scheduled for kyphoplasty tomorrow morning.  Aneta Minshillip would like her to got to STR and be placed from there into LTC.  Will complete FL[facility] and [facility]  Current with PCP, Dr. Hyacinth MeekerMiller.  [facility] [place].  Aneta Minshillip organizes her medications and places them in an am/pm pill box.  He states she had not taken her medications for a week.  Will continue to follow and assist with transition to STR.             Expected Discharge Plan: Skilled Nursing Facility Barriers to Discharge: Continued Medical Work up   Patient Goals and CMS Choice Patient states their goals for this hospitalization and ongoing recovery are:: Aneta Minshillip HPOA states she cannot go back to independent living CMS Medicare.gov Compare Post Acute Care list provided to:: Patient Represenative (must comment)(Nephew [facility]) Choice offered to / list presented to : [facility] POA / Guardian  Expected Discharge Plan and Services Expected Discharge Plan: Skilled Nursing Facility   Discharge Planning Services: CM Consult Post Acute Care Choice: Skilled Nursing Facility Living arrangements for the past 2 months: Independent Living Facility                                      Prior Living Arrangements/Services Living arrangements for the past 2 months: Independent Living Facility Lives with:: Self Patient language and  need for interpreter reviewed:: Yes            Current home services: Homehealth aide, Home PT, Home RN Criminal Activity/Legal Involvement Pertinent to Current Situation/Hospitalization: No - Comment as needed  Activities of Daily Living Home Assistive Devices/Equipment: Cane (specify quad or straight) ADL Screening (condition at time of admission) Patient's cognitive ability adequate to safely complete daily activities?: No Is the patient deaf or have difficulty hearing?: No Does the patient have difficulty seeing, even when wearing glasses/contacts?: No Does the patient have difficulty concentrating, remembering, or making decisions?: Yes Patient able to express need for assistance with ADLs?: Yes Does the patient have difficulty dressing or bathing?: Yes Independently performs ADLs?: Yes (appropriate for developmental age) Does the patient have difficulty walking or climbing stairs?: Yes Weakness of Legs: Both Weakness of Arms/Hands: Both  Permission Sought/Granted Permission sought to share information with : Facility Engineer, maintenance (IT)Contact Representative       Permission granted to share info w AGENCY: SNF        Emotional Assessment Appearance:: Appears stated age Attitude/Demeanor/Rapport: Gracious Affect (typically observed): Accepting Orientation: : Fluctuating Orientation (Suspected and/or reported Sundowners) Alcohol / Substance Use: Not Applicable    Admission diagnosis:  Elevated troponin I level [R79.89] Fall, initial encounter [W19.XXXA] Thoracic compression fracture, closed, initial encounter [facility](HCC) [S22.000A] Patient Active Problem List   Diagnosis Date Noted  . Back pain 12/11/2018  . Mixed hyperlipidemia 12/11/2018  .  Dehydration   . Palliative care by specialist   . Goals of care, counseling/discussion   . Encounter for hospice care discussion   . Acute kidney failure (HCC) 12/08/2018  . Venous insufficiency of both lower extremities 06/25/2018  . Chest pain  05/17/2017  . B12 deficiency 01/26/2016  . Epiretinal membrane (ERM) of left eye 11/03/2015  . CRAO (central retinal artery occlusion), right 11/03/2015  . Severe tricuspid valve insufficiency 02/23/2015  . Benign essential hypertension 01/23/2015  . Acquired hypothyroidism 10/29/2014  . Moderate mitral insufficiency 07/30/2014  . Chronic pulmonary hypertension (HCC) 07/30/2014  . Restless leg syndrome 04/30/2014  . LBBB (left bundle branch block) 11/19/2013  . CAD (coronary artery disease) of artery bypass graft 11/19/2013   PCP:  Danella Penton, MD Pharmacy:   [facility] [phone] - [place], [place] - [address] AT [facility] OF [address] [place] [address] [place] [address] Phone: [phone] Fax: [phone]     Social Determinants of Health (SDOH) Interventions    Readmission Risk Interventions Readmission Risk Prevention Plan 12/12/2018  Transportation Screening Complete  PCP or Specialist Appt within 5-7 Days Not Complete  Not Complete comments PCP Dr. Hyacinth Meeker No appt. yet  Home Care Screening Complete  Medication Review (RN CM) Complete  Some recent data might be hidden

## 2018-12-12 NOTE — Progress Notes (Signed)
PT Cancellation Note  Patient Details Name: Gabriella Hawkins MRN: [id number] DOB: [birth year]   Cancelled Treatment:    Reason Eval/Treat Not Completed: Other (comment)(Patient supine in bed on PT arrival. When PT attempted evaluation, patient attempted to get up from the bed stating "I think I'll go back to my room now." PT reoriented patient and patient asked to be seen later after she had rested. PT confirmed bed alarm was set and phone was in reach prior to departure.) PT will follow up at a later time/date as medically appropriate.  Sheria Lang PT, DPT [phone] 12/12/2018, 9:59 AM

## 2018-12-12 NOTE — Progress Notes (Addendum)
[employer] [facility] [place] [facility]   PATIENT NAME: Gabriella Hawkins    MR#:  [id number]  DATE OF BIRTH:  01/20/1920  SUBJECTIVE:  Patient with back pain not sure if she wants procedure  REVIEW OF SYSTEMS:    Review of Systems  Constitutional: Negative for fever, chills weight loss HENT: Negative for ear pain, nosebleeds, congestion, facial swelling, rhinorrhea, neck pain, neck stiffness and ear discharge.   Respiratory: Negative for cough, shortness of breath, wheezing  Cardiovascular: Negative for chest pain, palpitations and leg swelling.  Gastrointestinal: Negative for heartburn, abdominal pain, vomiting, diarrhea or consitpation Genitourinary: Negative for dysuria, urgency, frequency, hematuria Musculoskeletal: ++back pain or no joint pain Neurological: Negative for dizziness, seizures, syncope, focal weakness,  numbness and headaches.  Hematological: Does not bruise/bleed easily.  Psychiatric/Behavioral: Negative for hallucinations, confusion, dysphoric mood    Tolerating Diet: yes      DRUG ALLERGIES:   Allergies  Allergen Reactions  . Phenobarbital Other (See Comments)    Unknown   . Tramadol Other (See Comments)    Unknown  . Lisinopril Other (See Comments)    Dizziness     VITALS:  Blood pressure 109/71, pulse 65, temperature 97.9 F (36.6 C), temperature source Oral, resp. rate 18, height 5\' 3"  (1.6 m), weight 64.9 kg, SpO2 96 %.  PHYSICAL EXAMINATION:  Constitutional: Appears well-developed and well-nourished. No distress. HENT: Normocephalic. Marland Kitchen. Oropharynx is clear and moist.  Eyes: Conjunctivae and EOM are normal. PERRLA, no scleral icterus.  Neck: Normal ROM. Neck supple. No JVD. No tracheal deviation. CVS: RRR, S1/S2 +, no murmurs, no gallops, no carotid bruit.  Pulmonary: Effort and breath sounds normal, no stridor, rhonchi, wheezes, rales.  Abdominal: Soft. BS +,  no distension, tenderness, rebound or guarding.  Musculoskeletal:  Normal range of motion. No edema and no tenderness.  Neuro: Alert. CN 2-12 grossly intact. No focal deficits. Skin: Skin is warm and dry. No rash noted. Psychiatric: Normal mood and affect.      LABORATORY PANEL:   CBC Recent Labs  Lab 12/12/18 0033  WBC 7.1  HGB 12.2  HCT 36.2  PLT 249   ------------------------------------------------------------------------------------------------------------------  Chemistries  Recent Labs  Lab 12/11/18 0927 12/12/18 0033  NA 134* 133*  K 3.6 3.8  CL 96* 96*  CO2 23 25  GLUCOSE 106* 136*  BUN 23 32*  CREATININE 0.96 1.16*  CALCIUM 9.2 8.7*  AST 40  --   ALT 29  --   ALKPHOS 82  --   BILITOT 1.5*  --    ------------------------------------------------------------------------------------------------------------------  Cardiac Enzymes Recent Labs  Lab 12/11/18 1518 12/11/18 1902 12/12/18 0033  TROPONINI 0.27* 0.28* 0.29*   ------------------------------------------------------------------------------------------------------------------  RADIOLOGY:  Dg Thoracic Spine 2 View  Result Date: 12/11/2018 CLINICAL DATA:  85 year old female with fall and right-sided back pain EXAM: THORACIC SPINE 2 VIEWS COMPARISON:  02/22/2018 FINDINGS: Thoracic Spine: Accentuated kyphotic curvature. Osteopenia. T9 compression fracture present on a prior plain film 02/22/2018. Relative unchanged degree of compression. There is a new T10 compression fracture with approximately 50% height loss. Multilevel degenerative changes throughout the thoracic spine Unremarkable appearance of the visualized thorax. IMPRESSION: New T10 compression fracture with approximately 50% height loss when compared to the chest x-ray 02/22/2018. Unchanged configuration of T9 compression fracture. Osteopenia and degenerative changes. Electronically Signed   By: Gilmer MorJaime  Wagner D.O.   On: 12/11/2018 09:53   Dg Lumbar Spine 2-3 Views  Result Date: 12/11/2018 CLINICAL DATA:   Status post fall, back  pain EXAM: LUMBAR SPINE - 2-3 VIEW COMPARISON:  CT lumbar spine 04/01/2013 FINDINGS: There are 5 nonrib bearing lumbar-type vertebral bodies. There is a chronic L2 vertebral body compression fracture. The remainder the vertebral body heights are maintained. There is generalized osteopenia. There is a dextroscoliosis of the thoracolumbar spine. There is no static listhesis. There is no spondylolysis. There is no acute fracture. There is degenerative disc disease with disc height loss throughout the lumbar spine. Bilateral facet arthropathy throughout the lumbar spine most severe at L4-5 and L5-S1. The SI joints are unremarkable. There is abdominal aortic atherosclerosis. IMPRESSION: 1.  No acute osseous injury of the lumbar spine. 2. Lumbar spine spondylosis. Electronically Signed   By: Elige Ko   On: 12/11/2018 09:51   Dg Pelvis 1-2 Views  Result Date: 12/11/2018 CLINICAL DATA:  85 year old with fall and back pain. EXAM: PELVIS - 1-2 VIEW COMPARISON:  CT lumbar spine 04/01/2013 FINDINGS: Pelvic bony ring is intact. No gross abnormality to either hip. Atherosclerotic calcifications in the aorta and iliac arteries. Gas and stool in the rectum. IMPRESSION: No acute bone abnormality in the pelvis. Electronically Signed   By: Richarda Overlie M.D.   On: 12/11/2018 09:52   Ct Head Wo Contrast  Result Date: 12/11/2018 CLINICAL DATA:  Unwitnessed fall. Head trauma, intracranial venous injury suspected. EXAM: CT HEAD WITHOUT CONTRAST CT CERVICAL SPINE WITHOUT CONTRAST TECHNIQUE: Multidetector CT imaging of the head and cervical spine was performed following the standard protocol without intravenous contrast. Multiplanar CT image reconstructions of the cervical spine were also generated. COMPARISON:  CT head without contrast 12/07/2018 FINDINGS: CT HEAD FINDINGS Brain: Moderate atrophy and white matter disease is similar the prior exam. No acute infarct, hemorrhage, or mass lesion is present. The  ventricles are of proportionate to the degree of atrophy. No significant extraaxial fluid collection is present. The brainstem and cerebellum are within normal limits. Vascular: Atherosclerotic calcifications are present within the cavernous internal carotid arteries. There is no hyperdense vessel. Skull: No significant extracranial soft tissue injury is present. Calvarium is intact. No acute or healing fractures are present. Sinuses/Orbits: Paranasal sinuses and mastoid air cells are clear. Bilateral lens replacements are present. CT CERVICAL SPINE FINDINGS Alignment: Grade 1 anterolisthesis is present at C6-7 at C7-T1. There slight anterolisthesis at T1-2. Mild rightward curvature is present. There is no significant interval change. Skull base and vertebrae: Craniocervical junction is normal. No acute or healing fractures are present. Soft tissues and spinal canal: Atherosclerotic calcifications are present. Soft tissues are otherwise unremarkable. Stenosis appears worse on the left. Disc levels:  Multilevel degenerative changes are again noted. Upper chest: Scarring is present the lung apices. No focal nodule, mass, or airspace disease is present. IMPRESSION: 1. No acute trauma. 2. Stable atrophy and white matter disease. 3. Multilevel degenerative changes the cervical spine. 4. Atherosclerosis. Electronically Signed   By: Marin Roberts M.D.   On: 12/11/2018 10:17   Ct Cervical Spine Wo Contrast  Result Date: 12/11/2018 CLINICAL DATA:  Unwitnessed fall. Head trauma, intracranial venous injury suspected. EXAM: CT HEAD WITHOUT CONTRAST CT CERVICAL SPINE WITHOUT CONTRAST TECHNIQUE: Multidetector CT imaging of the head and cervical spine was performed following the standard protocol without intravenous contrast. Multiplanar CT image reconstructions of the cervical spine were also generated. COMPARISON:  CT head without contrast 12/07/2018 FINDINGS: CT HEAD FINDINGS Brain: Moderate atrophy and white matter  disease is similar the prior exam. No acute infarct, hemorrhage, or mass lesion is present. The ventricles are of  proportionate to the degree of atrophy. No significant extraaxial fluid collection is present. The brainstem and cerebellum are within normal limits. Vascular: Atherosclerotic calcifications are present within the cavernous internal carotid arteries. There is no hyperdense vessel. Skull: No significant extracranial soft tissue injury is present. Calvarium is intact. No acute or healing fractures are present. Sinuses/Orbits: Paranasal sinuses and mastoid air cells are clear. Bilateral lens replacements are present. CT CERVICAL SPINE FINDINGS Alignment: Grade 1 anterolisthesis is present at C6-7 at C7-T1. There slight anterolisthesis at T1-2. Mild rightward curvature is present. There is no significant interval change. Skull base and vertebrae: Craniocervical junction is normal. No acute or healing fractures are present. Soft tissues and spinal canal: Atherosclerotic calcifications are present. Soft tissues are otherwise unremarkable. Stenosis appears worse on the left. Disc levels:  Multilevel degenerative changes are again noted. Upper chest: Scarring is present the lung apices. No focal nodule, mass, or airspace disease is present. IMPRESSION: 1. No acute trauma. 2. Stable atrophy and white matter disease. 3. Multilevel degenerative changes the cervical spine. 4. Atherosclerosis. Electronically Signed   By: Marin Roberts M.D.   On: 12/11/2018 10:17   Mr Thoracic Spine Wo Contrast  Result Date: 12/11/2018 CLINICAL DATA:  Initial evaluation for acute back pain, mechanical fall. EXAM: MRI THORACIC SPINE WITHOUT CONTRAST TECHNIQUE: Multiplanar, multisequence MR imaging of the thoracic spine was performed. No intravenous contrast was administered. COMPARISON:  Comparison made with prior radiograph from earlier the same day. FINDINGS: Alignment: Prominent levoscoliosis of the thoracic spine with  exaggeration of the normal thoracic kyphosis. No listhesis or malalignment. Vertebrae: Acute burst type compression fracture seen involving the T10 vertebral body, with extension through both the anterior and posterior cortices. Up to approximately 25% height loss with 5 mm bony retropulsion. Resultant mild spinal stenosis without cord impingement. This is benign/mechanical in appearance, with no appreciable underlying lesion. Adjacent chronic compression deformity involving the T9 vertebral body with up to 75% height loss without bony retropulsion noted. Additional chronic compression deformity of the superior endplate of L2 noted as well. Vertebral body heights otherwise maintained. Underlying bone marrow signal intensity within normal limits. Few small benign hemangiomas noted. No worrisome osseous lesions. Reactive marrow edema about the L1-2 interspace is degenerative in nature. No other abnormal marrow edema. T4 through T6 vertebral bodies are partially ankylosed anteriorly. Cord: Signal intensity within the thoracic spinal cord is normal. Normal cord caliber and morphology. Conus medullaris terminates at approximately the L1 level. Paraspinal and other soft tissues: Mild soft tissue edema adjacent to the T10 compression fracture. Paraspinous soft tissues demonstrate no other acute abnormality. Small layering bilateral pleural effusions noted. Extremely large hiatal hernia partially visualized. Atherosclerotic change noted within the visualized aorta. Disc levels: T1-2: Small central disc protrusion minimally indents the ventral thecal sac. Mild facet hypertrophy. No stenosis. T2-3: Mild disc bulge, asymmetric to the right.  No stenosis. T3-4:  Unremarkable. T4-5: T4 and T5 vertebral bodies partially ankylosed anteriorly. Otherwise unremarkable. T5-6: T5 and T6 vertebral bodies partially ankylosed anteriorly. Otherwise unremarkable. T6-7:  Minimal facet hypertrophy.  Otherwise unremarkable. T7-8: Central disc  osteophyte indents the ventral thecal sac. Slight superior migration noted. No significant stenosis or cord deformity. T8-9:  Mild disc bulge.  No significant canal or foraminal stenosis. T9-10: 5 mm bony retropulsion related to the T10 compression fracture. Resultant mild spinal stenosis. No cord impingement. Foramina remain patent. T10-11:  Mild facet hypertrophy.  No significant stenosis. T11-12: Mild disc bulge with facet hypertrophy. No significant stenosis. T12-L1:  Minimal annular disc bulge.  No significant stenosis. IMPRESSION: 1. Acute burst type compression fracture involving the T10 vertebral body with up to 25% height loss and 5 mm bony retropulsion. Associated mild spinal stenosis without cord impingement. This is benign/mechanical in appearance. 2. Additional chronic compression fractures involving the T9 and L2 vertebral bodies. 3. Central disc osteophyte at T7-8 without significant spinal stenosis or cord impingement. 4. Large hiatal hernia with the majority of the stomach position within the thorax. 5. Aortic atherosclerosis. Electronically Signed   By: Rise Mu M.D.   On: 12/11/2018 20:33     ASSESSMENT AND PLAN:   85 year old female with a history of CAD and hypertension who presented to the emergency room due to back pain and found to have T10 fracture.  1.  Acute burst compression fracture involving T10 vertebral body: Current plan is for kyphoplasty.  Dr. Rosita Kea did talk to patient again about procedure.  Continue PT and pain medication as needed. Consider TLSO brace.  2.  CAD: Continue atorvastatin and metoprolol Holding aspirin and Plavix in anticipation of surgery tomorrow.  3.  Hypothyroid: Continue Synthroid  4.  Essential hypertension: Continue metoprolol 5.  Elevated troponin due to demand ischemia.  Patient ruled out for ACS.  D/w palliative care  Management plans discussed with the patient and she is in agreement.  CODE STATUS: DNR  TOTAL TIME  TAKING CARE OF THIS PATIENT: 30 minutes.     POSSIBLE D/C tomorrow, DEPENDING ON CLINICAL CONDITION.   Adrian Saran M.D on 12/12/2018 at 1:10 PM  Between 7am to 6pm - Pager - [phone] After 6pm go to [email or web address] - password [email or web address]  [facility]  Office  [phone]  CC: Primary care physician; Danella Penton, MD  Note: This dictation was prepared with Dragon dictation along with smaller phrase technology. Any transcriptional errors that result from this process are unintentional.

## 2018-12-13 ENCOUNTER — Encounter: Payer: Self-pay | Admitting: Orthopedic Surgery

## 2018-12-13 ENCOUNTER — Encounter
Admission: EM | Disposition: A | Discharge status: Skilled Nursing Facility | Payer: Self-pay | Source: Home / Self Care | Attending: Emergency Medicine

## 2018-12-13 ENCOUNTER — Observation Stay: Payer: Medicare Other | Admitting: Anesthesiology

## 2018-12-13 ENCOUNTER — Observation Stay: Payer: Medicare Other

## 2018-12-13 DIAGNOSIS — Z515 Encounter for palliative care: Secondary | ICD-10-CM | POA: Diagnosis not present

## 2018-12-13 HISTORY — PX: KYPHOPLASTY: SHX5884

## 2018-12-13 LAB — BASIC METABOLIC PANEL
Anion gap: 9 (ref 5–15)
BUN: 38 mg/dL — ABNORMAL HIGH (ref 8–23)
CO2: 26 mmol/L (ref 22–32)
Calcium: 8.9 mg/dL (ref 8.9–10.3)
Chloride: 100 mmol/L (ref 98–111)
Creatinine, Ser: 1.24 mg/dL — ABNORMAL HIGH (ref 0.44–1.00)
GFR calc Af Amer: 42 mL/min — ABNORMAL LOW (ref >60)
GFR calc non Af Amer: 37 mL/min — ABNORMAL LOW (ref >60)
Glucose, Bld: 123 mg/dL — ABNORMAL HIGH (ref 70–99)
Potassium: 3.9 mmol/L (ref 3.5–5.1)
Sodium: 135 mmol/L (ref 135–145)

## 2018-12-13 LAB — NOVEL CORONAVIRUS, NAA (HOSP ORDER, SEND-OUT TO REF LAB; TAT 18-24 HRS): SARS-CoV-2, NAA: NOT DETECTED

## 2018-12-13 SURGERY — KYPHOPLASTY
Anesthesia: Monitor Anesthesia Care | Site: Back

## 2018-12-13 MED ORDER — IOPAMIDOL (ISOVUE-M 200) INJECTION 41%
INTRAMUSCULAR | Status: DC | PRN
  Administered 2018-12-13 (×2): 20 mL

## 2018-12-13 MED ORDER — PROPOFOL 10 MG/ML IV BOLUS
INTRAVENOUS | Status: DC | PRN
  Administered 2018-12-13: 20 mg via INTRAVENOUS
  Administered 2018-12-13 (×2): 10 mg via INTRAVENOUS

## 2018-12-13 MED ORDER — SODIUM CHLORIDE FLUSH 0.9 % IV SOLN
INTRAVENOUS | Status: AC
  Filled 2018-12-13: qty 10

## 2018-12-13 MED ORDER — SODIUM CHLORIDE 0.9 % IV SOLN
INTRAVENOUS | Status: DC | PRN
  Administered 2018-12-13: 08:00:00 via INTRAVENOUS

## 2018-12-13 MED ORDER — LIDOCAINE HCL 1 % IJ SOLN
INTRAMUSCULAR | Status: DC | PRN
  Administered 2018-12-13: 20 mL

## 2018-12-13 MED ORDER — PROPOFOL 500 MG/50ML IV EMUL
INTRAVENOUS | Status: AC
  Filled 2018-12-13: qty 50

## 2018-12-13 MED ORDER — PHENYLEPHRINE HCL (PRESSORS) 10 MG/ML IV SOLN
INTRAVENOUS | Status: DC | PRN
  Administered 2018-12-13 (×4): 100 ug via INTRAVENOUS

## 2018-12-13 MED ORDER — FENTANYL CITRATE (PF) 100 MCG/2ML IJ SOLN: 12.5000 ug | INTRAMUSCULAR | Status: DC | PRN

## 2018-12-13 MED ORDER — BUPIVACAINE-EPINEPHRINE (PF) 0.5% -1:200000 IJ SOLN
INTRAMUSCULAR | Status: DC | PRN
  Administered 2018-12-13: 10 mL via PERINEURAL

## 2018-12-13 SURGICAL SUPPLY — 21 items
CEMENT KYPHON CX01A KIT/MIXER (Cement) ×3 IMPLANT
COVER WAND RF STERILE (DRAPES) ×3 IMPLANT
DERMABOND ADVANCED (GAUZE/BANDAGES/DRESSINGS) ×2
DERMABOND ADVANCED .7 DNX12 (GAUZE/BANDAGES/DRESSINGS) ×1 IMPLANT
DEVICE BIOPSY BONE KYPH (INSTRUMENTS) ×3 IMPLANT
DEVICE BIOPSY BONE KYPHX (INSTRUMENTS) ×3 IMPLANT
DRAPE C-ARM XRAY 36X54 (DRAPES) ×3 IMPLANT
DURAPREP 26ML APPLICATOR (WOUND CARE) ×3 IMPLANT
GLOVE SURG SYN 9.0  PF PI (GLOVE) ×2
GLOVE SURG SYN 9.0 PF PI (GLOVE) ×1 IMPLANT
GOWN SRG 2XL LVL 4 RGLN SLV (GOWNS) ×1 IMPLANT
GOWN STRL NON-REIN 2XL LVL4 (GOWNS) ×2
GOWN STRL REUS W/ TWL LRG LVL3 (GOWN DISPOSABLE) ×1 IMPLANT
GOWN STRL REUS W/TWL LRG LVL3 (GOWN DISPOSABLE) ×2
PACK KYPHOPLASTY (MISCELLANEOUS) ×3 IMPLANT
RENTAL RFA  GENERATOR (MISCELLANEOUS)
RENTAL RFA GENERATOR (MISCELLANEOUS) IMPLANT
STRAP SAFETY 5IN WIDE (MISCELLANEOUS) ×3 IMPLANT
TRAY KYPHOPAK 15/2 EXPRESS (KITS) ×3 IMPLANT
TRAY KYPHOPAK 15/3 EXPRESS 1ST (MISCELLANEOUS) ×3 IMPLANT
TRAY KYPHOPAK 20/3 EXPRESS 1ST (MISCELLANEOUS) ×3 IMPLANT

## 2018-12-13 NOTE — Anesthesia Procedure Notes (Signed)
Date/Time: 12/13/2018 8:00 AM Performed by: Junious Silk, CRNA Pre-anesthesia Checklist: Patient identified, Emergency Drugs available, Suction available, Patient being monitored and Timeout performed Oxygen Delivery Method: Nasal cannula

## 2018-12-13 NOTE — TOC Progression Note (Signed)
Transition of Care [facility]) - Progression Note    Patient Details  Name: Gabriella Hawkins MRN: [id number] Date of Birth: 04/20/1920  [facility] CM/SW Contact  Chapman Fitch, RN Phone Number: 12/13/2018, 5:05 PM  Clinical Narrative:    Bed offers presented to nephew Aneta Mins.  [facility] was selected.  [facility] at [facility] notified and she is to start insurance authorization    Expected Discharge Plan: Skilled Nursing Facility Barriers to Discharge: Continued Medical Work up  Expected Discharge Plan and Services Expected Discharge Plan: Skilled Nursing Facility   Discharge Planning Services: CM Consult Post Acute Care Choice: Skilled Nursing Facility Living arrangements for the past 2 months: Independent Living Facility                                       Social Determinants of Health (SDOH) Interventions    Readmission Risk Interventions Readmission Risk Prevention Plan 12/12/2018  Transportation Screening Complete  PCP or Specialist Appt within 5-7 Days Not Complete  Not Complete comments PCP Dr. Hyacinth Meeker No appt. yet  Home Care Screening Complete  Medication Review (RN CM) Complete  Some recent data might be hidden

## 2018-12-13 NOTE — Progress Notes (Signed)
Daily Progress Note   Patient Name: Gabriella Hawkins       Date: 12/13/2018 DOB: 25-Feb-1920  Age: 85 y.o. MRN#: [id number] Attending Physician: Adrian Saran, MD Primary Care Physician: Danella Penton, MD Admit Date: 12/11/2018  Reason for Consultation/Follow-up: Establishing goals of care  Subjective: Patient is resting in bed. Kyphoplasty completed. She is complaining of pain in her back. Staff made aware.    Length of Stay: 0  Current Medications: Scheduled Meds:  . atorvastatin  20 mg Oral q1800  . cholecalciferol  1,000 Units Oral Daily  . feeding supplement (ENSURE ENLIVE)  237 mL Oral BID BM  . gabapentin  100 mg Oral QHS  . levothyroxine  75 mcg Oral BH-q7a  . metoprolol tartrate  12.5 mg Oral BID  . sodium chloride flush  10 mL Intravenous Q12H  . sodium chloride flush      . vitamin B-12  1,000 mcg Oral Daily    Continuous Infusions:   PRN Meds: acetaminophen, HYDROcodone-acetaminophen, morphine injection, ondansetron **OR** ondansetron (ZOFRAN) IV  Physical Exam Pulmonary:     Effort: Pulmonary effort is normal.  Skin:    General: Skin is warm and dry.  Neurological:     Mental Status: She is alert.     Comments: Answers questions appropriately.              Vital Signs: BP (!) 94/48 (BP Location: Left Arm)   Pulse 69   Temp 97.9 F (36.6 C) (Oral)   Resp 15   Ht 5\' 3"  (1.6 m)   Wt 56.5 kg   SpO2 100%   BMI 22.06 kg/m  SpO2: SpO2: 100 % O2 Device: O2 Device: Room Air O2 Flow Rate: O2 Flow Rate (L/min): 2 L/min  Intake/output summary:   Intake/Output Summary (Last 24 hours) at 12/13/2018 1325 Last data filed at 12/13/2018 3893 Gross per 24 hour  Intake 540 ml  Output 130 ml  Net 410 ml   LBM: Last BM Date: (PTA) Baseline Weight:  Weight: 66.7 kg Most recent weight: Weight: 56.5 kg       Palliative Assessment/Data:    Flowsheet Rows     Most Recent Value  Intake Tab  Referral Department  -- [ED]  Unit at Time of Referral  ER  Date Notified  12/11/18  Palliative Care Type  Return patient Palliative Care  Reason for referral  Clarify Goals of Care  Date of Admission  12/11/18  Date first seen by Palliative Care  12/11/18  # of days Palliative referral response time  0 Day(s)  # of days IP prior to Palliative referral  0  Clinical Assessment  Psychosocial & Spiritual Assessment  Palliative Care Outcomes      Patient Active Problem List   Diagnosis Date Noted  . Back pain 12/11/2018  . Mixed hyperlipidemia 12/11/2018  . Dehydration   . Palliative care by specialist   . Goals of care, counseling/discussion   . Encounter for hospice care discussion   . Acute kidney failure (HCC) 12/08/2018  . Venous insufficiency of both lower extremities 06/25/2018  . Chest pain 05/17/2017  . B12 deficiency 01/26/2016  . Epiretinal membrane (ERM) of left eye 11/03/2015  . CRAO (central retinal artery occlusion), right 11/03/2015  . Severe tricuspid valve insufficiency 02/23/2015  . Benign essential hypertension 01/23/2015  . Acquired hypothyroidism 10/29/2014  . Moderate mitral insufficiency 07/30/2014  . Chronic pulmonary hypertension (HCC) 07/30/2014  . Restless leg syndrome 04/30/2014  . LBBB (left bundle branch block) 11/19/2013  . CAD (coronary artery disease) of artery bypass graft 11/19/2013    Palliative Care Assessment & Plan   Recommendations/Plan:  Recommend palliative at D/C.     Code Status:    Code Status Orders  (From admission, onward)         Start     Ordered   12/11/18 1426  Do not attempt resuscitation (DNR)  Continuous    Question Answer Comment  In the event of cardiac or respiratory ARREST Do not call a "code blue"   In the event of cardiac or respiratory ARREST Do not  perform Intubation, CPR, defibrillation or ACLS   In the event of cardiac or respiratory ARREST Use medication by any route, position, wound care, and other measures to relive pain and suffering. May use oxygen, suction and manual treatment of airway obstruction as needed for comfort.      12/11/18 1425        Code Status History    Date Active Date Inactive Code Status Order ID Comments User Context   12/08/2018 0406 12/10/2018 1921 DNR [id number]  Raliegh ScarletSidney, Katrina, NP ED   12/08/2018 [phone] 12/08/2018 0406 DNR [id number]  Raliegh ScarletSidney, Katrina, NP ED   12/08/2018 [phone] 12/08/2018 0312 DNR [id number]  Raliegh ScarletSidney, Katrina, NP ED   05/17/2017 0605 05/18/2017 1809 DNR [id number]  Ihor AustinPyreddy, Pavan, MD ED    Advance Directive Documentation     Most Recent Value  Type of Advance Directive  Out of facility DNR (pink MOST or yellow form)  Pre-existing out of facility DNR order (yellow form or pink MOST form)  Yellow form placed in chart (order not valid for inpatient use)  "MOST" Form in Place?  -       Prognosis:   Unable to determine  Discharge Planning:  To Be Determined  Care plan was discussed with primary team. IM sent to Dr. Rosita KeaMenz.   Thank you for allowing the Palliative Medicine Team to assist in the care of this patient.   Total Time 15 min Prolonged Time Billed  no      Greater than 50%  of this time was spent counseling and coordinating care related to the above assessment and plan.  Morton Stallrystal Raul Torrance, NP  Please contact Palliative Medicine Team phone at [phone] for questions and concerns.

## 2018-12-13 NOTE — Progress Notes (Signed)
PT Cancellation Note  Patient Details Name: Gabriella Hawkins MRN: [id number] DOB: 1920/05/16   Cancelled Treatment:    Reason Eval/Treat Not Completed: Pain limiting ability to participate Per RN patient having increased pain following kyphoplasty this am that is not managed at this time. RN and PT agreed session will be more therapeutic at a time that patient can participate better with more pain management. Will attempt tomorrow as able.   [name] PT, DPT [name] 12/13/2018, 2:09 PM

## 2018-12-13 NOTE — Anesthesia Post-op Follow-up Note (Signed)
Anesthesia QCDR form completed.        

## 2018-12-13 NOTE — Anesthesia Postprocedure Evaluation (Signed)
Anesthesia Post Note  Patient: Gabriella Hawkins  Procedure(s) Performed: KYPHOPLASTY T-10 (N/A Back)  Patient location during evaluation: PACU Anesthesia Type: General and MAC Level of consciousness: awake and alert Pain management: pain level controlled Vital Signs Assessment: post-procedure vital signs reviewed and stable Respiratory status: spontaneous breathing, nonlabored ventilation and respiratory function stable Cardiovascular status: blood pressure returned to baseline and stable Postop Assessment: no apparent nausea or vomiting Anesthetic complications: no     Last Vitals:  Vitals:   12/13/18 0912 12/13/18 0924  BP: (!) 94/44 (!) 94/48  Pulse: 69   Resp: 15   Temp:  36.6 C  SpO2: 100%     Last Pain:  Vitals:   12/13/18 1313  TempSrc:   PainSc: [place]

## 2018-12-13 NOTE — Transfer of Care (Signed)
Immediate Anesthesia Transfer of Care Note  Patient: Gabriella Hawkins  Procedure(s) Performed: KYPHOPLASTY T-10 (N/A Back)  Patient Location: PACU  Anesthesia Type:MAC  Level of Consciousness: awake  Airway & Oxygen Therapy: Patient Spontanous Breathing and Patient connected to nasal cannula oxygen  Post-op Assessment: Report given to RN and Post -op Vital signs reviewed and stable  Post vital signs: Reviewed and stable  Last Vitals:  Vitals Value Taken Time  BP    Temp    Pulse 80 12/13/2018  8:36 AM  Resp    SpO2 97 % 12/13/2018  8:36 AM  Vitals shown include unvalidated device data.  Last Pain:  Vitals:   12/13/18 0426  TempSrc: Oral  PainSc:          Complications: No apparent anesthesia complications

## 2018-12-13 NOTE — Consult Note (Signed)
Reason for Consult: Preoperative clearance thoracic vertebrae fracture borderline troponin Referring Physician: Dr. Enedina Finner Dr. Grayling Congress Gabriella Hawkins is an 85 y.o. female.  HPI: Patient is a 85 year old female recent admission on the 10th now here again for fall this time so sure of her thoracic vertebrae patient is now preop.  Patient is a history of CVA arteriosclerotic vascular disease thyroid disease hyperlipidemia.  Patient denies any significant cardiac symptoms reportedly had a borderline troponin which remained relatively consistent because of borderline troponin and preoperative status cardiology was done recommended for further preoperative assessment prior to surgery.  After interview patient patient has no significant cardiac symptoms at this point: Arteriosclerotic vascular disease.  Abnormal EKG with left bundle branch block.  Patient now preop for surgery.  Denies any chest pain angina palpitation tachycardia no worsening shortness of breath dyspnea.  Past Medical History:  Diagnosis Date  . CAD (coronary artery disease)   . Carotid atherosclerosis   . CVA (cerebral vascular accident) (HCC)   . Hypertension     Past Surgical History:  Procedure Laterality Date  . APPENDECTOMY    . CORONARY ARTERY BYPASS GRAFT    . RETINAL DETACHMENT SURGERY    . THYROID SURGERY      Family History  Problem Relation Age of Onset  . Hypertension Neg Hx   . Diabetes Mellitus II Neg Hx     Social History:  reports that she has never smoked. She has never used smokeless tobacco. She reports that she does not drink alcohol or use drugs.  Allergies:  Allergies  Allergen Reactions  . Phenobarbital Other (See Comments)    Unknown   . Tramadol Other (See Comments)    Unknown  . Lisinopril Other (See Comments)    Dizziness     Medications: I have reviewed the patient's current medications.  Results for orders placed or performed during the hospital encounter of 12/11/18  (from the past 48 hour(s))  CBC with Differential     Status: None   Collection Time: 12/11/18  9:27 AM  Result Value Ref Range   WBC 8.1 4.0 - 10.5 K/uL   RBC 4.99 3.87 - 5.11 MIL/uL   Hemoglobin 14.2 12.0 - 15.0 g/dL   HCT 16.1 09.6 - 04.5 %   MCV 86.2 80.0 - 100.0 fL   MCH 28.5 26.0 - 34.0 pg   MCHC 33.0 30.0 - 36.0 g/dL   RDW 40.9 81.1 - 91.4 %   Platelets 253 150 - 400 K/uL   nRBC 0.0 0.0 - 0.2 %   Neutrophils Relative % 72 %   Neutro Abs 5.8 1.7 - 7.7 K/uL   Lymphocytes Relative 17 %   Lymphs Abs 1.4 0.7 - 4.0 K/uL   Monocytes Relative 9 %   Monocytes Absolute 0.7 0.1 - 1.0 K/uL   Eosinophils Relative 1 %   Eosinophils Absolute 0.1 0.0 - 0.5 K/uL   Basophils Relative 0 %   Basophils Absolute 0.0 0.0 - 0.1 K/uL   Immature Granulocytes 1 %   Abs Immature Granulocytes 0.07 0.00 - 0.07 K/uL    Comment: Performed at [facility], [address] [place], [place] [address]  Comprehensive metabolic panel     Status: Abnormal   Collection Time: 12/11/18  9:27 AM  Result Value Ref Range   Sodium 134 (L) 135 - 145 mmol/L   Potassium 3.6 3.5 - 5.1 mmol/L   Chloride 96 (L) 98 - 111 mmol/L   CO2 23  22 - 32 mmol/L   Glucose, Bld 106 (H) 70 - 99 mg/dL   BUN 23 8 - 23 mg/dL   Creatinine, Ser 3.08 0.44 - 1.00 mg/dL   Calcium 9.2 8.9 - 65.7 mg/dL   Total Protein 7.2 6.5 - 8.1 g/dL   Albumin 3.8 3.5 - 5.0 g/dL   AST 40 15 - 41 U/L   ALT 29 0 - 44 U/L   Alkaline Phosphatase 82 38 - 126 U/L   Total Bilirubin 1.5 (H) 0.3 - 1.2 mg/dL   GFR calc non Af Amer 50 (L) >60 mL/min   GFR calc Af Amer 58 (L) >60 mL/min   Anion gap 15 5 - 15    Comment: Performed at [facility], [address] [place], [place] [address]  Urinalysis, Complete w Microscopic     Status: Abnormal   Collection Time: 12/11/18  9:27 AM  Result Value Ref Range   Color, Urine YELLOW (A) YELLOW   APPearance CLEAR (A) CLEAR   Specific Gravity, Urine 1.019 1.005 - 1.030   pH 6.0 5.0 - 8.0    Glucose, UA NEGATIVE NEGATIVE mg/dL   Hgb urine dipstick SMALL (A) NEGATIVE   Bilirubin Urine NEGATIVE NEGATIVE   Ketones, ur 20 (A) NEGATIVE mg/dL   Protein, ur 30 (A) NEGATIVE mg/dL   Nitrite NEGATIVE NEGATIVE   Leukocytes,Ua NEGATIVE NEGATIVE   RBC / HPF 0-5 0 - 5 RBC/hpf   WBC, UA 0-5 0 - 5 WBC/hpf   Bacteria, UA NONE SEEN NONE SEEN   Squamous Epithelial / LPF 0-5 0 - 5   Mucus PRESENT     Comment: Performed at [facility], [address] [place], [place] [address]  Troponin I - ONCE - STAT     Status: Abnormal   Collection Time: 12/11/18  9:27 AM  Result Value Ref Range   Troponin I 0.23 (HH) <0.03 ng/mL    Comment: CRITICAL RESULT CALLED TO, READ BACK BY AND VERIFIED WITH  ASHLEY SMITH AT 1005 12/11/2018 SDR Performed at [facility], [address] [place], [place] [address]   Troponin I - ONCE - STAT     Status: Abnormal   Collection Time: 12/11/18 11:12 AM  Result Value Ref Range   Troponin I 0.30 (HH) <0.03 ng/mL    Comment: CRITICAL VALUE NOTED. VALUE IS CONSISTENT WITH PREVIOUSLY REPORTED/CALLED VALUE MJU/SDR Performed at [facility], [address] [place], [place] [address]   Troponin I - Now Then Q6H     Status: Abnormal   Collection Time: 12/11/18  3:18 PM  Result Value Ref Range   Troponin I 0.27 (HH) <0.03 ng/mL    Comment: CRITICAL VALUE NOTED. VALUE IS CONSISTENT WITH PREVIOUSLY REPORTED/CALLED VALUE KLW Performed at [facility], [address] [place], [place] [address]   Troponin I - Now Then Q6H     Status: Abnormal   Collection Time: 12/11/18  7:02 PM  Result Value Ref Range   Troponin I 0.28 (HH) <0.03 ng/mL    Comment: CRITICAL VALUE NOTED. VALUE IS CONSISTENT WITH PREVIOUSLY REPORTED/CALLED VALUE KLW Performed at [facility], [address] [place], [place] [address]   Troponin I - Now Then Q6H     Status: Abnormal   Collection Time: 12/12/18 12:33 AM  Result Value Ref Range   Troponin I  0.29 (HH) <0.03 ng/mL    Comment: CRITICAL VALUE NOTED. VALUE IS CONSISTENT WITH PREVIOUSLY REPORTED/CALLED VALUE SMA Performed at [facility], [address] [place], [place] [address]   CBC     Status: None   Collection Time: 12/12/18 12:33 AM  Result Value Ref Range   WBC 7.1 4.0 - 10.5 K/uL   RBC 4.29 3.87 - 5.11 MIL/uL   Hemoglobin 12.2 12.0 - 15.0 g/dL   HCT 60.4 54.0 - 98.1 %   MCV 84.4 80.0 - 100.0 fL   MCH 28.4 26.0 - 34.0 pg   MCHC 33.7 30.0 - 36.0 g/dL   RDW 19.1 47.8 - 29.5 %   Platelets 249 150 - 400 K/uL   nRBC 0.0 0.0 - 0.2 %    Comment: Performed at [facility], [address] [place], [place] [address]  Basic metabolic panel     Status: Abnormal   Collection Time: 12/12/18 12:33 AM  Result Value Ref Range   Sodium 133 (L) 135 - 145 mmol/L   Potassium 3.8 3.5 - 5.1 mmol/L   Chloride 96 (L) 98 - 111 mmol/L   CO2 25 22 - 32 mmol/L   Glucose, Bld 136 (H) 70 - 99 mg/dL   BUN 32 (H) 8 - 23 mg/dL   Creatinine, Ser 8.65 (H) 0.44 - 1.00 mg/dL   Calcium 8.7 (L) 8.9 - 10.3 mg/dL   GFR calc non Af Amer 40 (L) >60 mL/min   GFR calc Af Amer 46 (L) >60 mL/min   Anion gap 12 5 - 15    Comment: Performed at [facility], [address] [place], [place] [address]    Dg Thoracic Spine 2 View  Result Date: 12/11/2018 CLINICAL DATA:  85 year old female with fall and right-sided back pain EXAM: THORACIC SPINE 2 VIEWS COMPARISON:  02/22/2018 FINDINGS: Thoracic Spine: Accentuated kyphotic curvature. Osteopenia. T9 compression fracture present on a prior plain film 02/22/2018. Relative unchanged degree of compression. There is a new T10 compression fracture with approximately 50% height loss. Multilevel degenerative changes throughout the thoracic spine Unremarkable appearance of the visualized thorax. IMPRESSION: New T10 compression fracture with approximately 50% height loss when compared to the chest x-ray 02/22/2018. Unchanged configuration of T9  compression fracture. Osteopenia and degenerative changes. Electronically Signed   By: Gilmer Mor D.O.   On: 12/11/2018 09:53   Dg Lumbar Spine 2-3 Views  Result Date: 12/11/2018 CLINICAL DATA:  Status post fall, back pain EXAM: LUMBAR SPINE - 2-3 VIEW COMPARISON:  CT lumbar spine 04/01/2013 FINDINGS: There are 5 nonrib bearing lumbar-type vertebral bodies. There is a chronic L2 vertebral body compression fracture. The remainder the vertebral body heights are maintained. There is generalized osteopenia. There is a dextroscoliosis of the thoracolumbar spine. There is no static listhesis. There is no spondylolysis. There is no acute fracture. There is degenerative disc disease with disc height loss throughout the lumbar spine. Bilateral facet arthropathy throughout the lumbar spine most severe at L4-5 and L5-S1. The SI joints are unremarkable. There is abdominal aortic atherosclerosis. IMPRESSION: 1.  No acute osseous injury of the lumbar spine. 2. Lumbar spine spondylosis. Electronically Signed   By: Elige Ko   On: 12/11/2018 09:51   Dg Pelvis 1-2 Views  Result Date: 12/11/2018 CLINICAL DATA:  85 year old with fall and back pain. EXAM: PELVIS - 1-2 VIEW COMPARISON:  CT lumbar spine 04/01/2013 FINDINGS: Pelvic bony ring is intact. No gross abnormality to either hip. Atherosclerotic calcifications in the aorta and iliac arteries. Gas and stool in the rectum. IMPRESSION: No acute bone abnormality in the pelvis. Electronically Signed  By: Richarda Overlie M.D.   On: 12/11/2018 09:52   Ct Head Wo Contrast  Result Date: 12/11/2018 CLINICAL DATA:  Unwitnessed fall. Head trauma, intracranial venous injury suspected. EXAM: CT HEAD WITHOUT CONTRAST CT CERVICAL SPINE WITHOUT CONTRAST TECHNIQUE: Multidetector CT imaging of the head and cervical spine was performed following the standard protocol without intravenous contrast. Multiplanar CT image reconstructions of the cervical spine were also generated. COMPARISON:   CT head without contrast 12/07/2018 FINDINGS: CT HEAD FINDINGS Brain: Moderate atrophy and white matter disease is similar the prior exam. No acute infarct, hemorrhage, or mass lesion is present. The ventricles are of proportionate to the degree of atrophy. No significant extraaxial fluid collection is present. The brainstem and cerebellum are within normal limits. Vascular: Atherosclerotic calcifications are present within the cavernous internal carotid arteries. There is no hyperdense vessel. Skull: No significant extracranial soft tissue injury is present. Calvarium is intact. No acute or healing fractures are present. Sinuses/Orbits: Paranasal sinuses and mastoid air cells are clear. Bilateral lens replacements are present. CT CERVICAL SPINE FINDINGS Alignment: Grade 1 anterolisthesis is present at C6-7 at C7-T1. There slight anterolisthesis at T1-2. Mild rightward curvature is present. There is no significant interval change. Skull base and vertebrae: Craniocervical junction is normal. No acute or healing fractures are present. Soft tissues and spinal canal: Atherosclerotic calcifications are present. Soft tissues are otherwise unremarkable. Stenosis appears worse on the left. Disc levels:  Multilevel degenerative changes are again noted. Upper chest: Scarring is present the lung apices. No focal nodule, mass, or airspace disease is present. IMPRESSION: 1. No acute trauma. 2. Stable atrophy and white matter disease. 3. Multilevel degenerative changes the cervical spine. 4. Atherosclerosis. Electronically Signed   By: Marin Roberts M.D.   On: 12/11/2018 10:17   Ct Cervical Spine Wo Contrast  Result Date: 12/11/2018 CLINICAL DATA:  Unwitnessed fall. Head trauma, intracranial venous injury suspected. EXAM: CT HEAD WITHOUT CONTRAST CT CERVICAL SPINE WITHOUT CONTRAST TECHNIQUE: Multidetector CT imaging of the head and cervical spine was performed following the standard protocol without intravenous  contrast. Multiplanar CT image reconstructions of the cervical spine were also generated. COMPARISON:  CT head without contrast 12/07/2018 FINDINGS: CT HEAD FINDINGS Brain: Moderate atrophy and white matter disease is similar the prior exam. No acute infarct, hemorrhage, or mass lesion is present. The ventricles are of proportionate to the degree of atrophy. No significant extraaxial fluid collection is present. The brainstem and cerebellum are within normal limits. Vascular: Atherosclerotic calcifications are present within the cavernous internal carotid arteries. There is no hyperdense vessel. Skull: No significant extracranial soft tissue injury is present. Calvarium is intact. No acute or healing fractures are present. Sinuses/Orbits: Paranasal sinuses and mastoid air cells are clear. Bilateral lens replacements are present. CT CERVICAL SPINE FINDINGS Alignment: Grade 1 anterolisthesis is present at C6-7 at C7-T1. There slight anterolisthesis at T1-2. Mild rightward curvature is present. There is no significant interval change. Skull base and vertebrae: Craniocervical junction is normal. No acute or healing fractures are present. Soft tissues and spinal canal: Atherosclerotic calcifications are present. Soft tissues are otherwise unremarkable. Stenosis appears worse on the left. Disc levels:  Multilevel degenerative changes are again noted. Upper chest: Scarring is present the lung apices. No focal nodule, mass, or airspace disease is present. IMPRESSION: 1. No acute trauma. 2. Stable atrophy and white matter disease. 3. Multilevel degenerative changes the cervical spine. 4. Atherosclerosis. Electronically Signed   By: Marin Roberts M.D.   On: 12/11/2018 10:17  Mr Thoracic Spine Wo Contrast  Result Date: 12/11/2018 CLINICAL DATA:  Initial evaluation for acute back pain, mechanical fall. EXAM: MRI THORACIC SPINE WITHOUT CONTRAST TECHNIQUE: Multiplanar, multisequence MR imaging of the thoracic spine  was performed. No intravenous contrast was administered. COMPARISON:  Comparison made with prior radiograph from earlier the same day. FINDINGS: Alignment: Prominent levoscoliosis of the thoracic spine with exaggeration of the normal thoracic kyphosis. No listhesis or malalignment. Vertebrae: Acute burst type compression fracture seen involving the T10 vertebral body, with extension through both the anterior and posterior cortices. Up to approximately 25% height loss with 5 mm bony retropulsion. Resultant mild spinal stenosis without cord impingement. This is benign/mechanical in appearance, with no appreciable underlying lesion. Adjacent chronic compression deformity involving the T9 vertebral body with up to 75% height loss without bony retropulsion noted. Additional chronic compression deformity of the superior endplate of L2 noted as well. Vertebral body heights otherwise maintained. Underlying bone marrow signal intensity within normal limits. Few small benign hemangiomas noted. No worrisome osseous lesions. Reactive marrow edema about the L1-2 interspace is degenerative in nature. No other abnormal marrow edema. T4 through T6 vertebral bodies are partially ankylosed anteriorly. Cord: Signal intensity within the thoracic spinal cord is normal. Normal cord caliber and morphology. Conus medullaris terminates at approximately the L1 level. Paraspinal and other soft tissues: Mild soft tissue edema adjacent to the T10 compression fracture. Paraspinous soft tissues demonstrate no other acute abnormality. Small layering bilateral pleural effusions noted. Extremely large hiatal hernia partially visualized. Atherosclerotic change noted within the visualized aorta. Disc levels: T1-2: Small central disc protrusion minimally indents the ventral thecal sac. Mild facet hypertrophy. No stenosis. T2-3: Mild disc bulge, asymmetric to the right.  No stenosis. T3-4:  Unremarkable. T4-5: T4 and T5 vertebral bodies partially  ankylosed anteriorly. Otherwise unremarkable. T5-6: T5 and T6 vertebral bodies partially ankylosed anteriorly. Otherwise unremarkable. T6-7:  Minimal facet hypertrophy.  Otherwise unremarkable. T7-8: Central disc osteophyte indents the ventral thecal sac. Slight superior migration noted. No significant stenosis or cord deformity. T8-9:  Mild disc bulge.  No significant canal or foraminal stenosis. T9-10: 5 mm bony retropulsion related to the T10 compression fracture. Resultant mild spinal stenosis. No cord impingement. Foramina remain patent. T10-11:  Mild facet hypertrophy.  No significant stenosis. T11-12: Mild disc bulge with facet hypertrophy. No significant stenosis. T12-L1:  Minimal annular disc bulge.  No significant stenosis. IMPRESSION: 1. Acute burst type compression fracture involving the T10 vertebral body with up to 25% height loss and 5 mm bony retropulsion. Associated mild spinal stenosis without cord impingement. This is benign/mechanical in appearance. 2. Additional chronic compression fractures involving the T9 and L2 vertebral bodies. 3. Central disc osteophyte at T7-8 without significant spinal stenosis or cord impingement. 4. Large hiatal hernia with the majority of the stomach position within the thorax. 5. Aortic atherosclerosis. Electronically Signed   By: Rise Mu M.D.   On: 12/11/2018 20:33    Review of Systems  Constitutional: Positive for diaphoresis and malaise/fatigue.  Eyes: Negative.   Respiratory: Positive for shortness of breath.   Cardiovascular: Positive for chest pain and leg swelling.  Gastrointestinal: Positive for heartburn.  Genitourinary: Positive for frequency.  Musculoskeletal: Positive for back pain, joint pain, myalgias and neck pain.  Skin: Negative.   Neurological: Positive for dizziness and sensory change.  Endo/Heme/Allergies: Negative.   Psychiatric/Behavioral: Negative.    Blood pressure (!) 162/86, pulse 87, temperature 98.8 F (37.1  C), temperature source Oral, resp. rate 20, height 5'  3" (1.6 m), weight 64.9 kg, SpO2 94 %. Physical Exam  Nursing note and vitals reviewed. Constitutional: She is oriented to person, place, and time. She appears well-developed and well-nourished.  HENT:  Head: Normocephalic and atraumatic.  Eyes: Pupils are equal, round, and reactive to light. Conjunctivae and EOM are normal.  Cardiovascular: Normal rate and regular rhythm.  Murmur heard. Respiratory: Effort normal.  GI: Soft.  Musculoskeletal:        General: Deformity present.  Neurological: She is alert and oriented to person, place, and time. She has normal reflexes.  Skin: Skin is warm.  Psychiatric: She has a normal mood and affect.    Assessment/Plan: Preop for neck surgery Multiple falls Borderline elevated troponin probably demand ischemia Hyperlipidemia Hypertension Thyroid disease History of CVA DJD . Plan Admitted because of multiple falls and subsequent thoracic vertebrae fracture Borderline troponins have been consistently about the same No significant cardiac symptoms No history of arteriosclerotic vascular disease No significant cardiac symptoms Abnormal EKG with left bundle branch block I do not recommend further inpatient evaluation to modify the risk Patient appears to be an acceptable risk for thoracic vertebra surgery Patient appears to be at least a moderate risk because of underlying comorbidity and age Any further cardiac evaluation is currently deferred  Alwyn PeaDwayne D Ival Pacer 12/13/2018, 3:16 AM

## 2018-12-13 NOTE — Op Note (Signed)
Date December 13, 2018  time 8:35 AM   PATIENT:  Gabriella Hawkins   PRE-OPERATIVE DIAGNOSIS:  closed wedge compression fracture of T10   POST-OPERATIVE DIAGNOSIS:  closed wedge compression fracture of T10   PROCEDURE:  Procedure(s): KYPHOPLASTY T10  SURGEON: Laurene Footman, MD   ASSISTANTS: None   ANESTHESIA:   local and MAC   EBL:  No intake/output data recorded.   BLOOD ADMINISTERED:none   DRAINS: none    LOCAL MEDICATIONS USED:  MARCAINE    and XYLOCAINE    SPECIMEN:   None   DISPOSITION OF SPECIMEN:  Not applicable   COUNTS:  YES   TOURNIQUET:  * No tourniquets in log *   IMPLANTS: Bone cement   DICTATION: .Dragon Dictation  patient was brought to the operating room and after adequate anesthesia was obtained the patient was placed prone.  C arm was brought in in good visualization of the affected level obtained on both AP and lateral projections.  After patient identification and timeout procedures were completed, local anesthetic was infiltrated with 10 cc 1% Xylocaine infiltrated subcutaneously.  This is done the area on the right side of the planned approach.  The back was then prepped and draped in the usual sterile manner and repeat timeout procedure carried out.  A spinal needle was brought down to the pedicle on the right side of  T10 and a 50-50 mix of 1% Xylocaine half percent Sensorcaine with epinephrine total of 20 cc injected.  After allowing this to set a small incision was made and the trocar was advanced into the vertebral body in an extrapedicular fashion.  Biopsy was not obtainable Drilling was carried out balloon inserted with inflation to  to cc.  When the cement was appropriate consistency 3 cc were injected into the vertebral body without extravasation, good fill superior to inferior endplates and from right to left sides along the inferior endplate.  After the cement had set the trochar was removed and permanent C-arm views obtained.  The wound was closed  with Dermabond followed by Band-Aid   PLAN OF CARE: Continue as observation patient   PATIENT DISPOSITION:  PACU - hemodynamically stable.

## 2018-12-13 NOTE — Progress Notes (Signed)
[employer] [facility] [place] [facility]   PATIENT NAME: Gabriella Hawkins    MR#:  [id number]  DATE OF BIRTH:  [birth year]  SUBJECTIVE:  Patient briefly seen after kyphoplasty still a bit groggy  REVIEW OF SYSTEMS:    Review of Systems  Constitutional: Negative for fever, chills weight loss HENT: Negative for ear pain, nosebleeds, congestion, facial swelling, rhinorrhea, neck pain, neck stiffness and ear discharge.   Respiratory: Negative for cough, shortness of breath, wheezing  Cardiovascular: Negative for chest pain, palpitations and leg swelling.  Gastrointestinal: Negative for heartburn, abdominal pain, vomiting, diarrhea or consitpation Genitourinary: Negative for dysuria, urgency, frequency, hematuria Musculoskeletal: denies back pain or no joint pain Neurological: Negative for dizziness, seizures, syncope, focal weakness,  numbness and headaches.  Hematological: Does not bruise/bleed easily.  Psychiatric/Behavioral: Negative for hallucinations, confusion, dysphoric mood    Tolerating Diet: npo      DRUG ALLERGIES:   Allergies  Allergen Reactions  . Phenobarbital Other (See Comments)    Unknown   . Tramadol Other (See Comments)    Unknown  . Lisinopril Other (See Comments)    Dizziness     VITALS:  Blood pressure (!) 94/48, pulse 69, temperature 97.9 F (36.6 C), temperature source Oral, resp. rate 15, height 5\' 3"  (1.6 m), weight 56.5 kg, SpO2 100 %.  PHYSICAL EXAMINATION:  Constitutional: Appears well-developed and well-nourished. No distress. HENT: Normocephalic. Marland Kitchen Oropharynx is clear and moist.  Eyes: Conjunctivae and EOM are normal. PERRLA, no scleral icterus.  Neck: Normal ROM. Neck supple. No JVD. No tracheal deviation. CVS: RRR, S1/S2 +, no murmurs, no gallops, no carotid bruit.  Pulmonary: Effort and breath sounds normal, no stridor, rhonchi, wheezes, rales.  Abdominal: Soft. BS +,  no distension, tenderness, rebound or guarding.   Musculoskeletal: can move Gabriella Hawkins legs wiggle toes No edema and no tenderness.  Neuro: . No focal deficits. Skin: Skin is warm and dry. No rash noted. Psychiatric: a bot groggly    LABORATORY PANEL:   CBC Recent Labs  Lab 12/12/18 0033  WBC 7.1  HGB 12.2  HCT 36.2  PLT 249   ------------------------------------------------------------------------------------------------------------------  Chemistries  Recent Labs  Lab 12/11/18 0927  12/13/18 0403  NA 134*   < > 135  K 3.6   < > 3.9  CL 96*   < > 100  CO2 23   < > 26  GLUCOSE 106*   < > 123*  BUN 23   < > 38*  CREATININE 0.96   < > 1.24*  CALCIUM 9.2   < > 8.9  AST 40  --   --   ALT 29  --   --   ALKPHOS 82  --   --   BILITOT 1.5*  --   --    < > = values in this interval not displayed.   ------------------------------------------------------------------------------------------------------------------  Cardiac Enzymes Recent Labs  Lab 12/11/18 1518 12/11/18 1902 12/12/18 0033  TROPONINI 0.27* 0.28* 0.29*   ------------------------------------------------------------------------------------------------------------------  RADIOLOGY:  Dg Thoracic Spine 2 View  Result Date: 12/13/2018 CLINICAL DATA:  T10 kyphoplasty for compression fracture. EXAM: THORACIC SPINE 2 VIEWS; DG C-ARM 61-120 MIN COMPARISON:  MRI of the thoracic spine on 12/11/2018 FINDINGS: Intraoperative imaging demonstrates methylmethacrylate distributed in the T10 vertebral body. No extruded cement is identified outside of the confines of the vertebral body. There is a compression fracture of T9. IMPRESSION: Imaging obtained during T10 vertebral augmentation demonstrating distribution of methylmethacrylate in the T10 vertebral body. Electronically  Irish LackGlenn  Yamagata M.D.   On: 12/13/2018 09:00   Mr Thoracic Spine Wo Contrast  Result Date: 12/11/2018 CLINICAL DATA:  Initial evaluation for acute back pain, mechanical fall. EXAM: MRI THORACIC  SPINE WITHOUT CONTRAST TECHNIQUE: Multiplanar, multisequence MR imaging of the thoracic spine was performed. No intravenous contrast was administered. COMPARISON:  Comparison made with prior radiograph from earlier the same day. FINDINGS: Alignment: Prominent levoscoliosis of the thoracic spine with exaggeration of the normal thoracic kyphosis. No listhesis or malalignment. Vertebrae: Acute burst type compression fracture seen involving the T10 vertebral body, with extension through both the anterior and posterior cortices. Up to approximately 25% height loss with 5 mm bony retropulsion. Resultant mild spinal stenosis without cord impingement. This is benign/mechanical in appearance, with no appreciable underlying lesion. Adjacent chronic compression deformity involving the T9 vertebral body with up to 75% height loss without bony retropulsion noted. Additional chronic compression deformity of the superior endplate of L2 noted as well. Vertebral body heights otherwise maintained. Underlying bone marrow signal intensity within normal limits. Few small benign hemangiomas noted. No worrisome osseous lesions. Reactive marrow edema about the L1-2 interspace is degenerative in nature. No other abnormal marrow edema. T4 through T6 vertebral bodies are partially ankylosed anteriorly. Cord: Signal intensity within the thoracic spinal cord is normal. Normal cord caliber and morphology. Conus medullaris terminates at approximately the L1 level. Paraspinal and other soft tissues: Mild soft tissue edema adjacent to the T10 compression fracture. Paraspinous soft tissues demonstrate no other acute abnormality. Small layering bilateral pleural effusions noted. Extremely large hiatal hernia partially visualized. Atherosclerotic change noted within the visualized aorta. Disc levels: T1-2: Small central disc protrusion minimally indents the ventral thecal sac. Mild facet hypertrophy. No stenosis. T2-3: Mild disc bulge, asymmetric to  the right.  No stenosis. T3-4:  Unremarkable. T4-5: T4 and T5 vertebral bodies partially ankylosed anteriorly. Otherwise unremarkable. T5-6: T5 and T6 vertebral bodies partially ankylosed anteriorly. Otherwise unremarkable. T6-7:  Minimal facet hypertrophy.  Otherwise unremarkable. T7-8: Central disc osteophyte indents the ventral thecal sac. Slight superior migration noted. No significant stenosis or cord deformity. T8-9:  Mild disc bulge.  No significant canal or foraminal stenosis. T9-10: 5 mm bony retropulsion related to the T10 compression fracture. Resultant mild spinal stenosis. No cord impingement. Foramina remain patent. T10-11:  Mild facet hypertrophy.  No significant stenosis. T11-12: Mild disc bulge with facet hypertrophy. No significant stenosis. T12-L1:  Minimal annular disc bulge.  No significant stenosis. IMPRESSION: 1. Acute burst type compression fracture involving the T10 vertebral body with up to 25% height loss and 5 mm bony retropulsion. Associated mild spinal stenosis without cord impingement. This is benign/mechanical in appearance. 2. Additional chronic compression fractures involving the T9 and L2 vertebral bodies. 3. Central disc osteophyte at T7-8 without significant spinal stenosis or cord impingement. 4. Large hiatal hernia with the majority of the stomach position within the thorax. 5. Aortic atherosclerosis. Electronically Signed   By: Rise Mu M.D.   On: 12/11/2018 20:33   Dg C-arm 1-60 Min  Result Date: 12/13/2018 CLINICAL DATA:  T10 kyphoplasty for compression fracture. EXAM: THORACIC SPINE 2 VIEWS; DG C-ARM 61-120 MIN COMPARISON:  MRI of the thoracic spine on 12/11/2018 FINDINGS: Intraoperative imaging demonstrates methylmethacrylate distributed in the T10 vertebral body. No extruded cement is identified outside of the confines of the vertebral body. There is a compression fracture of T9. IMPRESSION: Imaging obtained during T10 vertebral augmentation demonstrating  distribution of methylmethacrylate in the T10 vertebral  body. Electronically Signed   By: Irish LackGlenn  Yamagata M.D.   On: 12/13/2018 09:00     ASSESSMENT AND PLAN:   85 year old female with a history of CAD and hypertension who presented to the emergency room due to back pain and found to have T10 fracture.  1.  Acute burst compression fracture involving T10 vertebral body: POD #0 kyphoplasty by Dr Rosita KeaMenz PT evaluation for d/c planning 2.  CAD: Continue atorvastatin and metoprolol Holding aspirin and Plavix in anticipation of surgery tomorrow.  3.  Hypothyroid: Continue Synthroid  4.  Essential hypertension: Continue metoprolol 5.  Elevated troponin due to demand ischemia.  Patient ruled out for ACS.  She will need outpatient palliative care services at discharge.  Management plans discussed with the patient and she is in agreement.  CODE STATUS: DNR  TOTAL TIME TAKING CARE OF THIS PATIENT: 22 minutes.     POSSIBLE D/C tomorrow skilled nursing facility, DEPENDING ON CLINICAL CONDITION.   Adrian SaranSital Jaivian Battaglini M.D on 12/13/2018 at 11:39 AM  Between 7am to 6pm - Pager - [phone] After 6pm go to [email or web address] - password [email or web address]  [facility]  Office  [phone]  CC: Primary care physician; Danella PentonMiller, Mark F, MD  Note: This dictation was prepared with Dragon dictation along with smaller phrase technology. Any transcriptional errors that result from this process are unintentional.

## 2018-12-14 MED ORDER — HYDROCODONE-ACETAMINOPHEN 5-325 MG PO TABS: 1.0000 | ORAL_TABLET | ORAL | 0 refills | Status: AC | PRN

## 2018-12-14 NOTE — Progress Notes (Signed)
Physical Therapy Treatment Patient Details Name: Gabriella Hawkins MRN: [id number] DOB: 07/20/1920 Today's Date: 12/14/2018    History of Present Illness 85 year old female with a history of CAD and hypertension who presented to the emergency room due to back pain and found to have T10 fracture.    PT Comments    Patient demonstrates good carry over of log roll technique for supine> EOB sitting following PT cuing. Patient able to demonstrate good STS technique and safety over multiple trials with RW with CGA needed for safety with tolieting. Patient requires cuing for RW management and to maintain visual field, but is able to demonstrate some obstacle negotiation following, supervision for safety. Would benefit from skilled PT to address above deficits and promote optimal return to PLOF    Follow Up Recommendations  Supervision/Assistance - 24 hour;Supervision for mobility/OOB;Home health PT;SNF     Equipment Recommendations  Standard walker    Recommendations for Other Services       Precautions / Restrictions Precautions Precautions: Fall Restrictions Weight Bearing Restrictions: No    Mobility  Bed Mobility Overal bed mobility: Modified Independent Bed Mobility: Supine to Sit;Sit to Supine     Supine to sit: Modified independent (Device/Increase time);Supervision Sit to supine: Modified independent (Device/Increase time);Supervision   General bed mobility comments: Cued patient through log roll technique to help with pain, which she is ble to complete following cuing  Transfers Overall transfer level: Modified independent Equipment used: Rolling walker (2 wheeled) Transfers: Sit to/from Stand Sit to Stand: Min guard         General transfer comment: Patient able to complete STS with RW with confedience and proper hand placement/set up  Ambulation/Gait Ambulation/Gait assistance: Supervision Gait Distance (Feet): 200 Feet Assistive device: Rolling walker  (2 wheeled) Gait Pattern/deviations: WFL(Within Functional Limits);Trunk flexed Gait velocity: normal   General Gait Details: Patient able to ambulate wtih min cuing to keep RW within BOS and to "keep her head up". Patient does well with obstacle negotiation in the hall following this cuing.    Stairs             Wheelchair Mobility    Modified Rankin (Stroke Patients Only)       Balance                                            Cognition Arousal/Alertness: Awake/alert Behavior During Therapy: WFL for tasks assessed/performed Overall Cognitive Status: No family/caregiver present to determine baseline cognitive functioning                                 General Comments: Patient oriented to person, not time or place, reports she knows she is in the hospital but does not recall where      Exercises Other Exercises Other Exercises: Bed mobility: Taught patient log roll technique to decrease pain, which patient is able to complete with accuracy following cuing Other Exercises: STS patient able to complete multiple times throughout session (from bed, to chair, to and from toilet) with good safety following min cuing for eccentric lowering. With toileting patient able to stand CGA and complete cleaning ind Other Exercises: Ambulation over 2700ft with RW with cuing to look ahead, keep RW inside BOS, and foot clearance. Good carry over and hallway obstacle negotiation following    General  Comments        Pertinent Vitals/Pain Pain Assessment: Faces Faces Pain Scale: Hurts a little bit Pain Location: mid back Pain Descriptors / Indicators: Aching    Home Living                      Prior Function            PT Goals (current goals can now be found in the care plan section) Acute Rehab PT Goals Patient Stated Goal: go home PT Goal Formulation: With patient Time For Goal Achievement: 12/26/18 Potential to Achieve Goals:  Fair    Frequency    Min 2X/week      PT Plan      Co-evaluation              AM-PAC PT "6 Clicks" Mobility   Outcome Measure  Help needed turning from your back to your side while in a flat bed without using bedrails?: A Little Help needed moving from lying on your back to sitting on the side of a flat bed without using bedrails?: A Little Help needed moving to and from a bed to a chair (including a wheelchair)?: A Little Help needed standing up from a chair using your arms (e.g., wheelchair or bedside chair)?: A Little Help needed to walk in hospital room?: A Little Help needed climbing 3-5 steps with a railing? : A Lot 6 Click Score: 17    End of Session Equipment Utilized During Treatment: Gait belt Activity Tolerance: Patient tolerated treatment well Patient left: in bed;with call bell/phone within reach;with bed alarm set Nurse Communication: Mobility status PT Visit Diagnosis: Difficulty in walking, not elsewhere classified (R26.2);Muscle weakness (generalized) (M62.81);Pain;History of falling (Z91.81)     Time: 1010-1036 PT Time Calculation (min) (ACUTE ONLY): 26 min  Charges:  $Gait Training: 8-22 mins $Therapeutic Activity: 8-22 mins                     [name] PT, DPT   [name] 12/14/2018, 12:21 PM

## 2018-12-14 NOTE — Discharge Instructions (Signed)
Fall Prevention in the Home, Adult  Falls can cause injuries. They can happen to people of all ages. There are many things you can do to make your home safe and to help prevent falls. Ask for help when making these changes, if needed.  What actions can I take to prevent falls?  General Instructions  · Use good lighting in all rooms. Replace any light bulbs that burn out.  · Turn on the lights when you go into a dark area. Use night-lights.  · Keep items that you use often in easy-to-reach places. Lower the shelves around your home if necessary.  · Set up your furniture so you have a clear path. Avoid moving your furniture around.  · Do not have throw rugs and other things on the floor that can make you trip.  · Avoid walking on wet floors.  · If any of your floors are uneven, fix them.  · Add color or contrast paint or tape to clearly mark and help you see:  ? Any grab bars or handrails.  ? First and last steps of stairways.  ? Where the edge of each step is.  · If you use a stepladder:  ? Make sure that it is fully opened. Do not climb a closed stepladder.  ? Make sure that both sides of the stepladder are locked into place.  ? Ask someone to hold the stepladder for you while you use it.  · If there are any pets around you, be aware of where they are.  What can I do in the bathroom?         · Keep the floor dry. Clean up any water that spills onto the floor as soon as it happens.  · Remove soap buildup in the tub or shower regularly.  · Use non-skid mats or decals on the floor of the tub or shower.  · Attach bath mats securely with double-sided, non-slip rug tape.  · If you need to sit down in the shower, use a plastic, non-slip stool.  · Install grab bars by the toilet and in the tub and shower. Do not use towel bars as grab bars.  What can I do in the bedroom?  · Make sure that you have a light by your bed that is easy to reach.  · Do not use any sheets or blankets that are too big for your bed. They should  not hang down onto the floor.  · Have a firm chair that has side arms. You can use this for support while you get dressed.  What can I do in the kitchen?  · Clean up any spills right away.  · If you need to reach something above you, use a strong step stool that has a grab bar.  · Keep electrical cords out of the way.  · Do not use floor polish or wax that makes floors slippery. If you must use wax, use non-skid floor wax.  What can I do with my stairs?  · Do not leave any items on the stairs.  · Make sure that you have a light switch at the top of the stairs and the bottom of the stairs. If you do not have them, ask someone to add them for you.  · Make sure that there are handrails on both sides of the stairs, and use them. Fix handrails that are broken or loose. Make sure that handrails are as long as the stairways.  ·   Install non-slip stair treads on all stairs in your home.  · Avoid having throw rugs at the top or bottom of the stairs. If you do have throw rugs, attach them to the floor with carpet tape.  · Choose a carpet that does not hide the edge of the steps on the stairway.  · Check any carpeting to make sure that it is firmly attached to the stairs. Fix any carpet that is loose or worn.  What can I do on the outside of my home?  · Use bright outdoor lighting.  · Regularly fix the edges of walkways and driveways and fix any cracks.  · Remove anything that might make you trip as you walk through a door, such as a raised step or threshold.  · Trim any bushes or trees on the path to your home.  · Regularly check to see if handrails are loose or broken. Make sure that both sides of any steps have handrails.  · Install guardrails along the edges of any raised decks and porches.  · Clear walking paths of anything that might make someone trip, such as tools or rocks.  · Have any leaves, snow, or ice cleared regularly.  · Use sand or salt on walking paths during winter.  · Clean up any spills in your garage right  away. This includes grease or oil spills.  What other actions can I take?  · Wear shoes that:  ? Have a low heel. Do not wear high heels.  ? Have rubber bottoms.  ? Are comfortable and fit you well.  ? Are closed at the toe. Do not wear open-toe sandals.  · Use tools that help you move around (mobility aids) if they are needed. These include:  ? Canes.  ? Walkers.  ? Scooters.  ? Crutches.  · Review your medicines with your doctor. Some medicines can make you feel dizzy. This can increase your chance of falling.  Ask your doctor what other things you can do to help prevent falls.  Where to find more information  · Centers for Disease Control and Prevention, STEADI: https://cdc.gov  · National Institute on Aging: https://go4life.nia.nih.gov  Contact a doctor if:  · You are afraid of falling at home.  · You feel weak, drowsy, or dizzy at home.  · You fall at home.  Summary  · There are many simple things that you can do to make your home safe and to help prevent falls.  · Ways to make your home safe include removing tripping hazards and installing grab bars in the bathroom.  · Ask for help when making these changes in your home.  This information is not intended to replace advice given to you by your health care provider. Make sure you discuss any questions you have with your health care provider.  Document Released: 05/28/2009 Document Revised: 03/16/2017 Document Reviewed: 03/16/2017  Elsevier Interactive Patient Education © 2019 Elsevier Inc.

## 2018-12-14 NOTE — Plan of Care (Signed)
  Problem: Health Behavior/Discharge Planning: Goal: Ability to manage health-related needs will improve Outcome: Progressing   Problem: Clinical Measurements: Goal: Ability to maintain clinical measurements within normal limits will improve Outcome: Progressing   

## 2018-12-14 NOTE — TOC Transition Note (Signed)
Transition of Care [facility]) - CM/SW Discharge Note   Patient Details  Name: Gabriella Hawkins MRN: [id number] Date of Birth: 12-04-1919  [facility] CM/SW Contact:  Sherren Kerns, RN Phone Number: 12/14/2018, 11:20 AM   Clinical Narrative:   Patient is ready for discharge to [facility].  Spoke with [facility] at [facility] and they are ready to accept her.  Called and left message with nephew, Gabriella Hawkins [phone] notifying him of discharge.  Patient will go to room 710.  EMS packet placed on chart with prescription and DNR; Brett Canales, RN aware.    Final next level of care: Skilled Nursing Facility Barriers to Discharge: No Barriers Identified   Patient Goals and CMS Choice Patient states their goals for this hospitalization and ongoing recovery are:: Gabriella Hawkins HPOA states she cannot go back to independent living CMS Medicare.gov Compare Post Acute Care list provided to:: Patient Represenative (must comment)(Nephew [facility]) Choice offered to / list presented to : [facility] POA / Guardian  Discharge Placement                       Discharge Plan and Services   Discharge Planning Services: CM Consult Post Acute Care Choice: Skilled Nursing Facility                             Social Determinants of Health (SDOH) Interventions     Readmission Risk Interventions Readmission Risk Prevention Plan 12/12/2018  Transportation Screening Complete  PCP or Specialist Appt within 5-7 Days Not Complete  Not Complete comments PCP Dr. Hyacinth Meeker No appt. yet  Home Care Screening Complete  Medication Review (RN CM) Complete  Some recent data might be hidden

## 2018-12-14 NOTE — Progress Notes (Signed)
Called and gave report to the receiving facility nurse at this time. All questions answered. Gabriella Hawkins M Gabriella Hawkins  Called [facility] and requested non-emergent patient transport to the receiving facility at this time. Awaiting arrival. [name] M Lucella Pommier 

## 2018-12-14 NOTE — Discharge Summary (Signed)
[facility] [place] [facility]   PATIENT NAME: Gabriella Hawkins    MR#:  [id number]  DATE OF BIRTH:  June 20, 1920  DATE OF ADMISSION:  12/11/2018 ADMITTING PHYSICIAN: Auburn Bilberry, MD  DATE OF DISCHARGE: 12/14/2018  PRIMARY CARE PHYSICIAN: Danella Penton, MD    ADMISSION DIAGNOSIS:  Elevated troponin I level [R79.89] Fall, initial encounter [W19.XXXA] Thoracic compression fracture, closed, initial encounter (HCC) [S22.000A]  DISCHARGE DIAGNOSIS:  Active Problems:   Back pain   SECONDARY DIAGNOSIS:   Past Medical History:  Diagnosis Date  . CAD (coronary artery disease)   . Carotid atherosclerosis   . CVA (cerebral vascular accident) (HCC)   . Hypertension     HOSPITAL COURSE:   85 year old female with a history of CAD and hypertension who presented to the emergency room due to back pain and found to have T10 fracture.  1.  Acute burst compression fracture involving T10 vertebral body: She is POD #1 kyphoplasty by Dr Rosita Kea. Back pain has improved.   She will follow-up with orthopedic surgery in 1 week.   She will continue physical therapy.    2.  CAD: Continue atorvastatin and metoprolol She will resume aspirin and Plavix.  3.  Hypothyroid: Continue Synthroid  4.  Essential hypertension: Continue metoprolol 5.  Elevated troponin due to demand ischemia.  Patient ruled out for ACS. She was eval by cardiology while in the hospital.    She will need outpatient palliative care services at discharge   DISCHARGE CONDITIONS AND DIET:   Stable for discharge on regular diet  CONSULTS OBTAINED:  Treatment Team:  Donato Heinz, MD Kennedy Bucker, MD  DRUG ALLERGIES:   Allergies  Allergen Reactions  . Phenobarbital Other (See Comments)    Unknown   . Tramadol Other (See Comments)    Unknown  . Lisinopril Other (See Comments)    Dizziness     DISCHARGE MEDICATIONS:   Allergies as of 12/14/2018      Reactions   Phenobarbital Other  (See Comments)   Unknown   Tramadol Other (See Comments)   Unknown   Lisinopril Other (See Comments)   Dizziness      Medication List    TAKE these medications   acetaminophen 325 MG tablet Commonly known as:  TYLENOL Take 650 mg by mouth every 4 (four) hours as needed.   amLODipine 10 MG tablet Commonly known as:  NORVASC Take 10 mg by mouth daily.   aspirin EC 81 MG tablet Take 81 mg by mouth daily.   atorvastatin 20 MG tablet Commonly known as:  LIPITOR Take 1 tablet (20 mg total) by mouth daily at 6 PM.   clopidogrel 75 MG tablet Commonly known as:  PLAVIX Take 1 tablet (75 mg total) by mouth daily.   gabapentin 100 MG capsule Commonly known as:  NEURONTIN Take 100 mg by mouth at bedtime.   HYDROcodone-acetaminophen 5-325 MG tablet Commonly known as:  NORCO/VICODIN Take 1-2 tablets by mouth every 4 (four) hours as needed for moderate pain.   levothyroxine 75 MCG tablet Commonly known as:  SYNTHROID Take 75 mcg by mouth every morning.   metoprolol tartrate 25 MG tablet Commonly known as:  LOPRESSOR Take 0.5 tablets (12.5 mg total) by mouth 2 (two) times daily.   vitamin B-12 1000 MCG tablet Commonly known as:  CYANOCOBALAMIN Take 1,000 mcg by mouth daily.   Vitamin D-1000 Max St 25 MCG (1000 UT) tablet Generic drug:  Cholecalciferol Take 1,000 Units by  mouth daily.         Today   CHIEF COMPLAINT:   Back pain has improved   VITAL SIGNS:  Blood pressure 132/82, pulse 73, temperature 98.4 F (36.9 C), temperature source Oral, resp. rate 20, height 5\' 3"  (1.6 m), weight 56.5 kg, SpO2 94 %.   REVIEW OF SYSTEMS:  Review of Systems  Constitutional: Negative.  Negative for chills, fever and malaise/fatigue.  HENT: Negative.  Negative for ear discharge, ear pain, hearing loss, nosebleeds and sore throat.   Eyes: Negative.  Negative for blurred vision and pain.  Respiratory: Negative.  Negative for cough, hemoptysis, shortness of breath and  wheezing.   Cardiovascular: Negative.  Negative for chest pain, palpitations and leg swelling.  Gastrointestinal: Negative.  Negative for abdominal pain, blood in stool, diarrhea, nausea and vomiting.  Genitourinary: Negative.  Negative for dysuria.  Musculoskeletal: Positive for back pain (better).  Skin: Negative.   Neurological: Negative for dizziness, tremors, speech change, focal weakness, seizures and headaches.  Endo/Heme/Allergies: Negative.  Does not bruise/bleed easily.  Psychiatric/Behavioral: Negative.  Negative for depression, hallucinations and suicidal ideas.     PHYSICAL EXAMINATION:  GENERAL:  85 y.o.-year-old patient lying in the bed with no acute distress.  NECK:  Supple, no jugular venous distention. No thyroid enlargement, no tenderness.  LUNGS: Normal breath sounds bilaterally, no wheezing, rales,rhonchi  No use of accessory muscles of respiration.  CARDIOVASCULAR: S1, S2 normal. No murmurs, rubs, or gallops.  ABDOMEN: Soft, non-tender, non-distended. Bowel sounds present. No organomegaly or mass.  EXTREMITIES: No pedal edema, cyanosis, or clubbing.  PSYCHIATRIC: The patient is alert and oriented x 3.  SKIN: No obvious rash, lesion, or ulcer.   DATA REVIEW:   CBC Recent Labs  Lab 12/12/18 0033  WBC 7.1  HGB 12.2  HCT 36.2  PLT 249    Chemistries  Recent Labs  Lab 12/11/18 0927  12/13/18 0403  NA 134*   < > 135  K 3.6   < > 3.9  CL 96*   < > 100  CO2 23   < > 26  GLUCOSE 106*   < > 123*  BUN 23   < > 38*  CREATININE 0.96   < > 1.24*  CALCIUM 9.2   < > 8.9  AST 40  --   --   ALT 29  --   --   ALKPHOS 82  --   --   BILITOT 1.5*  --   --    < > = values in this interval not displayed.    Cardiac Enzymes Recent Labs  Lab 12/11/18 1518 12/11/18 1902 12/12/18 0033  TROPONINI 0.27* 0.28* 0.29*    Microbiology Results  @[email or web address] @  RADIOLOGY:  Dg Thoracic Spine 2 View  Result Date: 12/13/2018 CLINICAL DATA:  T10 kyphoplasty for  compression fracture. EXAM: THORACIC SPINE 2 VIEWS; DG C-ARM 61-120 MIN COMPARISON:  MRI of the thoracic spine on 12/11/2018 FINDINGS: Intraoperative imaging demonstrates methylmethacrylate distributed in the T10 vertebral body. No extruded cement is identified outside of the confines of the vertebral body. There is a compression fracture of T9. IMPRESSION: Imaging obtained during T10 vertebral augmentation demonstrating distribution of methylmethacrylate in the T10 vertebral body. Electronically Signed   By: Irish Lack M.D.   On: 12/13/2018 09:00   Dg C-arm 1-60 Min  Result Date: 12/13/2018 CLINICAL DATA:  T10 kyphoplasty for compression fracture. EXAM: THORACIC SPINE 2 VIEWS; DG C-ARM 61-120 MIN COMPARISON:  MRI of the thoracic spine  on 12/11/2018 FINDINGS: Intraoperative imaging demonstrates methylmethacrylate distributed in the T10 vertebral body. No extruded cement is identified outside of the confines of the vertebral body. There is a compression fracture of T9. IMPRESSION: Imaging obtained during T10 vertebral augmentation demonstrating distribution of methylmethacrylate in the T10 vertebral body. Electronically Signed   By: Irish LackGlenn  Yamagata M.D.   On: 12/13/2018 09:00      Allergies as of 12/14/2018      Reactions   Phenobarbital Other (See Comments)   Unknown   Tramadol Other (See Comments)   Unknown   Lisinopril Other (See Comments)   Dizziness      Medication List    TAKE these medications   acetaminophen 325 MG tablet Commonly known as:  TYLENOL Take 650 mg by mouth every 4 (four) hours as needed.   amLODipine 10 MG tablet Commonly known as:  NORVASC Take 10 mg by mouth daily.   aspirin EC 81 MG tablet Take 81 mg by mouth daily.   atorvastatin 20 MG tablet Commonly known as:  LIPITOR Take 1 tablet (20 mg total) by mouth daily at 6 PM.   clopidogrel 75 MG tablet Commonly known as:  PLAVIX Take 1 tablet (75 mg total) by mouth daily.   gabapentin 100 MG  capsule Commonly known as:  NEURONTIN Take 100 mg by mouth at bedtime.   HYDROcodone-acetaminophen 5-325 MG tablet Commonly known as:  NORCO/VICODIN Take 1-2 tablets by mouth every 4 (four) hours as needed for moderate pain.   levothyroxine 75 MCG tablet Commonly known as:  SYNTHROID Take 75 mcg by mouth every morning.   metoprolol tartrate 25 MG tablet Commonly known as:  LOPRESSOR Take 0.5 tablets (12.5 mg total) by mouth 2 (two) times daily.   vitamin B-12 1000 MCG tablet Commonly known as:  CYANOCOBALAMIN Take 1,000 mcg by mouth daily.   Vitamin D-1000 Max St 25 MCG (1000 UT) tablet Generic drug:  Cholecalciferol Take 1,000 Units by mouth daily.          Management plans discussed with the patient and she is in agreement. Stable for discharge   Patient should follow up with ortho and PCP  CODE STATUS:     Code Status Orders  (From admission, onward)         Start     Ordered   12/11/18 1426  Do not attempt resuscitation (DNR)  Continuous    Question Answer Comment  In the event of cardiac or respiratory ARREST Do not call a "code blue"   In the event of cardiac or respiratory ARREST Do not perform Intubation, CPR, defibrillation or ACLS   In the event of cardiac or respiratory ARREST Use medication by any route, position, wound care, and other measures to relive pain and suffering. May use oxygen, suction and manual treatment of airway obstruction as needed for comfort.      12/11/18 1425        Code Status History    Date Active Date Inactive Code Status Order ID Comments User Context   12/08/2018 0406 12/10/2018 1921 DNR [id number]  Raliegh ScarletSidney, Katrina, NP ED   12/08/2018 0312 12/08/2018 0406 DNR [id number]  Raliegh ScarletSidney, Katrina, NP ED   12/08/2018 [id number] 12/08/2018 0312 DNR [id number]  Raliegh ScarletSidney, Katrina, NP ED   05/17/2017 [id number] 05/18/2017 1809 DNR [id number]  Ihor AustinPyreddy, Pavan, MD ED    Advance Directive Documentation     Most Recent Value  Type of Advance Directive  Out of  facility DNR (pink MOST or  yellow form)  Pre-existing out of facility DNR order (yellow form or pink MOST form)  Yellow form placed in chart (order not valid for inpatient use)  "MOST" Form in Place?  -      TOTAL TIME TAKING CARE OF THIS PATIENT: 38 minutes.    Note: This dictation was prepared with Dragon dictation along with smaller phrase technology. Any transcriptional errors that result from this process are unintentional.  Adrian Saran M.D on 12/14/2018 at 10:04 AM  Between 7am to 6pm - Pager - [phone] After 6pm go to [email or web address] - Social research officer, government  [facility]  Office  [phone]  CC: Primary care physician; Danella Penton, MD

## 2018-12-14 NOTE — Progress Notes (Signed)
Subjective: 1 Day Post-Op Procedure(s) (LRB): KYPHOPLASTY T-10 (N/A) Patient reports pain as 4 on 0-10 scale.   Patient is resting comfortably this morning. Plan is to go Skilled nursing facility after hospital stay. Negative for chest pain and shortness of breath Fever: no Gastrointestinal:Negative for nausea and vomiting  Objective: Vital signs in last 24 hours: Temp:  [97.5 F (36.4 C)-98.4 F (36.9 C)] 98.4 F (36.9 C) (05/01 0820) Pulse Rate:  [69-81] 73 (05/01 0820) Resp:  [15-20] 20 (05/01 0820) BP: (94-148)/(44-82) 132/82 (05/01 0820) SpO2:  [94 %-100 %] 94 % (05/01 0820)  Intake/Output from previous day:  Intake/Output Summary (Last 24 hours) at 12/14/2018 0841 Last data filed at 12/14/2018 0419 Gross per 24 hour  Intake -  Output 150 ml  Net -150 ml    Intake/Output this shift: No intake/output data recorded.  Labs: Recent Labs    12/11/18 0927 12/12/18 0033  HGB 14.2 12.2   Recent Labs    12/11/18 0927 12/12/18 0033  WBC 8.1 7.1  RBC 4.99 4.29  HCT 43.0 36.2  PLT 253 249   Recent Labs    12/12/18 0033 12/13/18 0403  NA 133* 135  K 3.8 3.9  CL 96* 100  CO2 25 26  BUN 32* 38*  CREATININE 1.16* 1.24*  GLUCOSE 136* 123*  CALCIUM 8.7* 8.9   No results for input(s): LABPT, INR in the last 72 hours.  EXAM General - Patient is Alert and Appropriate Laying on her back this morning without any pain. Minimal tenderness with palpation over the kyphoplasty site. No signs of infection. Motor Function - intact, moving foot and toes well on exam.   Past Medical History:  Diagnosis Date  . CAD (coronary artery disease)   . Carotid atherosclerosis   . CVA (cerebral vascular accident) (HCC)   . Hypertension     Assessment/Plan: 1 Day Post-Op Procedure(s) (LRB): KYPHOPLASTY T-10 (N/A) Active Problems:   Back pain  Estimated body mass index is 22.06 kg/m as calculated from the following:   Height as of this encounter: 5\' 3"  (1.6 m).   Weight  as of this encounter: 56.5 kg.   Patient appears in less pain this AM. Reports less pain following the surgery. Up with therapy today.  DVT Prophylaxis - TED hose  J. Horris Latino, PA-C [facility] 12/14/2018, 8:41 AM

## 2019-11-14 DEATH — deceased
